# Patient Record
Sex: Female | Born: 1985 | Race: Asian | Hispanic: No | Marital: Married | Smoking: Never smoker
Health system: Southern US, Community
[De-identification: ages and names within clinical notes are randomized; demographics above are authoritative.]

---

## 2017-10-09 ENCOUNTER — Emergency Department (HOSPITAL_BASED_OUTPATIENT_CLINIC_OR_DEPARTMENT_OTHER): Payer: 59

## 2017-10-09 ENCOUNTER — Encounter (HOSPITAL_BASED_OUTPATIENT_CLINIC_OR_DEPARTMENT_OTHER): Payer: Self-pay | Admitting: Emergency Medicine

## 2017-10-09 ENCOUNTER — Other Ambulatory Visit: Payer: Self-pay

## 2017-10-09 ENCOUNTER — Emergency Department (HOSPITAL_BASED_OUTPATIENT_CLINIC_OR_DEPARTMENT_OTHER)
Admission: EM | Admit: 2017-10-09 | Discharge: 2017-10-09 | Disposition: A | Discharge status: Home / Self Care | Payer: 59 | Attending: Emergency Medicine | Admitting: Emergency Medicine

## 2017-10-09 DIAGNOSIS — Z3A08 8 weeks gestation of pregnancy: Secondary | ICD-10-CM | POA: Diagnosis not present

## 2017-10-09 DIAGNOSIS — O26851 Spotting complicating pregnancy, first trimester: Secondary | ICD-10-CM

## 2017-10-09 DIAGNOSIS — O209 Hemorrhage in early pregnancy, unspecified: Secondary | ICD-10-CM | POA: Diagnosis present

## 2017-10-09 LAB — CBC
HCT: 39.2 % (ref 36.0–46.0)
HEMOGLOBIN: 13.3 g/dL (ref 12.0–15.0)
MCH: 29.5 pg (ref 26.0–34.0)
MCHC: 33.9 g/dL (ref 30.0–36.0)
MCV: 86.9 fL (ref 78.0–100.0)
Platelets: 265 10*3/uL (ref 150–400)
RBC: 4.51 MIL/uL (ref 3.87–5.11)
RDW: 12.8 % (ref 11.5–15.5)
WBC: 6.5 10*3/uL (ref 4.0–10.5)

## 2017-10-09 LAB — URINALYSIS, ROUTINE W REFLEX MICROSCOPIC
Bilirubin Urine: NEGATIVE
Glucose, UA: NEGATIVE mg/dL
KETONES UR: 15 mg/dL — AB
NITRITE: NEGATIVE
PROTEIN: NEGATIVE mg/dL
Specific Gravity, Urine: 1.015 (ref 1.005–1.030)
pH: 7.5 (ref 5.0–8.0)

## 2017-10-09 LAB — URINALYSIS, MICROSCOPIC (REFLEX)

## 2017-10-09 LAB — HCG, QUANTITATIVE, PREGNANCY: HCG, BETA CHAIN, QUANT, S: 125504 m[IU]/mL — AB (ref <5)

## 2017-10-09 LAB — PREGNANCY, URINE: PREG TEST UR: POSITIVE — AB

## 2017-10-09 NOTE — ED Notes (Signed)
Patient transported to Ultrasound 

## 2017-10-09 NOTE — ED Notes (Signed)
Gravida 2, Para 0, AB 1

## 2017-10-09 NOTE — ED Notes (Signed)
ED Provider at bedside. 

## 2017-10-09 NOTE — ED Triage Notes (Signed)
Patient states that she is having light spotting with urination  - she is [redacted] weeks pregnant. Patient went to her OB dr last week and all was normal

## 2017-10-09 NOTE — Discharge Instructions (Addendum)
Call your obgyn for follow up  Return to the emergency department for heavy vaginal bleeding as we discussed.

## 2017-10-10 NOTE — ED Provider Notes (Signed)
[facility] EMERGENCY DEPARTMENT Provider Note   CSN: [id number] Arrival date & time: 10/09/17  4098     History   Chief Complaint Chief Complaint  Patient presents with  . Vaginal Bleeding    HPI Veronica Randall is a 32 y.o. female.  HPI Patient is a 32 year old G2, P0 A1 with spontaneous miscarriage at approximately 8 to 12 weeks previously who is currently [redacted] weeks pregnant.  She presents the emergency department today because of some scant vaginal spotting.  Denies abdominal pain.  No lightheadedness.  No dysuria or urinary frequency. History reviewed. No pertinent past medical history.  There are no active problems to display for this patient.   History reviewed. No pertinent surgical history.   OB History    Gravida  1   Para      Term      Preterm      AB      Living        SAB      TAB      Ectopic      Multiple      Live Births               Home Medications    Prior to Admission medications   Not on File    Family History No family history on file.  Social History Social History   Tobacco Use  . Smoking status: Never Smoker  . Smokeless tobacco: Never Used  Substance Use Topics  . Alcohol use: Not Currently    Frequency: Never  . Drug use: Not Currently     Allergies   Patient has no known allergies.   Review of Systems Review of Systems  All other systems reviewed and are negative.    Physical Exam Updated Vital Signs BP 112/64 (BP Location: Right Arm)   Pulse 71   Temp 98.5 F (36.9 C) (Oral)   Resp 16   Ht 5\' 3"  (1.6 m)   Wt 90.7 kg   SpO2 100%   BMI 35.43 kg/m   Physical Exam  Constitutional: She is oriented to person, place, and time. She appears well-developed and well-nourished. No distress.  HENT:  Head: Normocephalic and atraumatic.  Eyes: EOM are normal.  Neck: Normal range of motion.  Cardiovascular: Normal rate and regular rhythm.  Pulmonary/Chest: Effort normal.  Abdominal: Soft.  She exhibits no distension. There is no tenderness.  Musculoskeletal: Normal range of motion.  Neurological: She is alert and oriented to person, place, and time.  Skin: Skin is warm and dry.  Psychiatric: She has a normal mood and affect. Judgment normal.  Nursing note and vitals reviewed.    ED Treatments / Results  Labs (all labs ordered are listed, but only abnormal results are displayed) Labs Reviewed  PREGNANCY, URINE - Abnormal; Notable for the following components:      Result Value   Preg Test, Ur POSITIVE (*)    All other components within normal limits  HCG, QUANTITATIVE, PREGNANCY - Abnormal; Notable for the following components:   hCG, Beta Chain, Quant, S 125,504 (*)    All other components within normal limits  URINALYSIS, ROUTINE W REFLEX MICROSCOPIC - Abnormal; Notable for the following components:   APPearance CLOUDY (*)    Hgb urine dipstick MODERATE (*)    Ketones, ur 15 (*)    Leukocytes, UA SMALL (*)    All other components within normal limits  URINALYSIS, MICROSCOPIC (REFLEX) - Abnormal; Notable for the  following components:   Bacteria, UA MANY (*)    All other components within normal limits  CBC    EKG None  Radiology US Ob Comp < 14 Wks  Result Date: 10/09/2017 CLINICAL DATA:  Vaginal bleeding in pregnant patient. EXAM: OBSTETRIC <14 WK [place] AND TRANSVAGINAL OB US TECHNIQUE: Both transabdominal and transvaginal ultrasound examinations were performed for complete evaluation of the gestation as well as the maternal uterus, adnexal regions, and pelvic cul-de-sac. Transvaginal technique was performed to assess early pregnancy. COMPARISON:  None. FINDINGS: Intrauterine gestational sac: Single Yolk sac:  Visualized. Embryo:  Visualized. Cardiac Activity: Visualized. Heart Rate: 165 bpm MSD:   mm    w     d CRL:  17 mm   8 w   1 d                  [place] [facility]: May 20, 2018 Subchorionic hemorrhage:  None visualized. Maternal uterus/adnexae: The ovaries are only seen  transabdominally with no obvious abnormality. Minimal fluid in the region of the cervix is nonspecific but consistent with the history of spotting. No free fluid in the pelvis. IMPRESSION: Single live IUP.  No underlying cause for spotting identified. Electronically Signed   By: Gerome Sam III M.D   On: 10/09/2017 11:29   US Ob Transvaginal  Result Date: 10/09/2017 CLINICAL DATA:  Vaginal bleeding in pregnant patient. EXAM: OBSTETRIC <14 WK [place] AND TRANSVAGINAL OB US TECHNIQUE: Both transabdominal and transvaginal ultrasound examinations were performed for complete evaluation of the gestation as well as the maternal uterus, adnexal regions, and pelvic cul-de-sac. Transvaginal technique was performed to assess early pregnancy. COMPARISON:  None. FINDINGS: Intrauterine gestational sac: Single Yolk sac:  Visualized. Embryo:  Visualized. Cardiac Activity: Visualized. Heart Rate: 165 bpm MSD:   mm    w     d CRL:  17 mm   8 w   1 d                  [place] [facility]: May 20, 2018 Subchorionic hemorrhage:  None visualized. Maternal uterus/adnexae: The ovaries are only seen transabdominally with no obvious abnormality. Minimal fluid in the region of the cervix is nonspecific but consistent with the history of spotting. No free fluid in the pelvis. IMPRESSION: Single live IUP.  No underlying cause for spotting identified. Electronically Signed   By: Gerome Sam III M.D   On: 10/09/2017 11:29    Procedures Procedures (including critical care time)  Medications Ordered in ED Medications - No data to display   Initial Impression / Assessment and Plan / ED Course  I have reviewed the triage vital signs and the nursing notes.  Pertinent labs & imaging results that were available during my care of the patient were reviewed by me and considered in my medical decision making (see chart for details).     Reassuring ultrasound.  Patient reassured.  Outpatient OB follow-up.  Understands return to the ER or to [facility] for new or worsening symptoms  Final Clinical Impressions(s) / ED Diagnoses   Final diagnoses:  Spotting affecting pregnancy in first trimester    ED Discharge Orders    None       Azalia Bilis, MD 10/10/17 1304

## 2018-04-20 DIAGNOSIS — Z3400 Encounter for supervision of normal first pregnancy, unspecified trimester: Secondary | ICD-10-CM | POA: Diagnosis not present

## 2018-04-20 DIAGNOSIS — Z3A35 35 weeks gestation of pregnancy: Secondary | ICD-10-CM | POA: Diagnosis not present

## 2018-04-20 DIAGNOSIS — O99213 Obesity complicating pregnancy, third trimester: Secondary | ICD-10-CM | POA: Diagnosis not present

## 2018-04-27 DIAGNOSIS — O99213 Obesity complicating pregnancy, third trimester: Secondary | ICD-10-CM | POA: Diagnosis not present

## 2018-04-27 DIAGNOSIS — Z3A37 37 weeks gestation of pregnancy: Secondary | ICD-10-CM | POA: Diagnosis not present

## 2018-05-05 DIAGNOSIS — Z3A38 38 weeks gestation of pregnancy: Secondary | ICD-10-CM | POA: Diagnosis not present

## 2018-05-05 DIAGNOSIS — O99213 Obesity complicating pregnancy, third trimester: Secondary | ICD-10-CM | POA: Diagnosis not present

## 2018-05-12 DIAGNOSIS — Z3A39 39 weeks gestation of pregnancy: Secondary | ICD-10-CM | POA: Diagnosis not present

## 2018-05-17 DIAGNOSIS — O99213 Obesity complicating pregnancy, third trimester: Secondary | ICD-10-CM | POA: Diagnosis not present

## 2018-05-17 DIAGNOSIS — O26893 Other specified pregnancy related conditions, third trimester: Secondary | ICD-10-CM | POA: Diagnosis not present

## 2018-05-17 DIAGNOSIS — Z3A39 39 weeks gestation of pregnancy: Secondary | ICD-10-CM | POA: Diagnosis not present

## 2019-06-22 ENCOUNTER — Other Ambulatory Visit: Payer: Self-pay

## 2019-06-22 ENCOUNTER — Inpatient Hospital Stay (HOSPITAL_BASED_OUTPATIENT_CLINIC_OR_DEPARTMENT_OTHER)
Admission: AD | Admit: 2019-06-22 | Discharge: 2019-06-26 | DRG: 418 | Disposition: A | Discharge status: Home / Self Care | Payer: 59 | Attending: Internal Medicine | Admitting: Internal Medicine

## 2019-06-22 ENCOUNTER — Encounter (HOSPITAL_BASED_OUTPATIENT_CLINIC_OR_DEPARTMENT_OTHER): Payer: Self-pay | Admitting: Emergency Medicine

## 2019-06-22 ENCOUNTER — Inpatient Hospital Stay (HOSPITAL_COMMUNITY): Payer: 59

## 2019-06-22 DIAGNOSIS — E860 Dehydration: Secondary | ICD-10-CM | POA: Diagnosis present

## 2019-06-22 DIAGNOSIS — E876 Hypokalemia: Secondary | ICD-10-CM | POA: Diagnosis present

## 2019-06-22 DIAGNOSIS — K838 Other specified diseases of biliary tract: Secondary | ICD-10-CM | POA: Diagnosis not present

## 2019-06-22 DIAGNOSIS — R739 Hyperglycemia, unspecified: Secondary | ICD-10-CM | POA: Diagnosis present

## 2019-06-22 DIAGNOSIS — E871 Hypo-osmolality and hyponatremia: Secondary | ICD-10-CM | POA: Diagnosis present

## 2019-06-22 DIAGNOSIS — R932 Abnormal findings on diagnostic imaging of liver and biliary tract: Secondary | ICD-10-CM

## 2019-06-22 DIAGNOSIS — K219 Gastro-esophageal reflux disease without esophagitis: Secondary | ICD-10-CM | POA: Diagnosis present

## 2019-06-22 DIAGNOSIS — Z6841 Body Mass Index (BMI) 40.0 and over, adult: Secondary | ICD-10-CM | POA: Diagnosis not present

## 2019-06-22 DIAGNOSIS — R1011 Right upper quadrant pain: Secondary | ICD-10-CM | POA: Diagnosis present

## 2019-06-22 DIAGNOSIS — R7989 Other specified abnormal findings of blood chemistry: Secondary | ICD-10-CM | POA: Diagnosis not present

## 2019-06-22 DIAGNOSIS — K851 Biliary acute pancreatitis without necrosis or infection: Secondary | ICD-10-CM | POA: Diagnosis not present

## 2019-06-22 DIAGNOSIS — Z20822 Contact with and (suspected) exposure to covid-19: Secondary | ICD-10-CM | POA: Diagnosis present

## 2019-06-22 DIAGNOSIS — K8062 Calculus of gallbladder and bile duct with acute cholecystitis without obstruction: Secondary | ICD-10-CM | POA: Diagnosis present

## 2019-06-22 DIAGNOSIS — R7303 Prediabetes: Secondary | ICD-10-CM | POA: Diagnosis present

## 2019-06-22 DIAGNOSIS — K8020 Calculus of gallbladder without cholecystitis without obstruction: Secondary | ICD-10-CM

## 2019-06-22 DIAGNOSIS — E66813 Obesity, class 3: Secondary | ICD-10-CM

## 2019-06-22 HISTORY — DX: Morbid (severe) obesity due to excess calories: E66.01

## 2019-06-22 HISTORY — DX: Obesity, class 3: E66.813

## 2019-06-22 LAB — RENAL FUNCTION PANEL
Albumin: 3.9 g/dL (ref 3.5–5.0)
Anion gap: 13 (ref 5–15)
BUN: 7 mg/dL (ref 6–20)
CO2: 25 mmol/L (ref 22–32)
Calcium: 9.2 mg/dL (ref 8.9–10.3)
Chloride: 97 mmol/L — ABNORMAL LOW (ref 98–111)
Creatinine, Ser: 0.58 mg/dL (ref 0.44–1.00)
GFR calc Af Amer: >60 mL/min (ref >60)
GFR calc non Af Amer: >60 mL/min (ref >60)
Glucose, Bld: 129 mg/dL — ABNORMAL HIGH (ref 70–99)
Phosphorus: 3 mg/dL (ref 2.5–4.6)
Potassium: 3.9 mmol/L (ref 3.5–5.1)
Sodium: 135 mmol/L (ref 135–145)

## 2019-06-22 LAB — CBC WITH DIFFERENTIAL/PLATELET
Abs Immature Granulocytes: 0.04 10*3/uL (ref 0.00–0.07)
Basophils Absolute: 0 10*3/uL (ref 0.0–0.1)
Basophils Relative: 0 %
Eosinophils Absolute: 0.2 10*3/uL (ref 0.0–0.5)
Eosinophils Relative: 2 %
HCT: 42.1 % (ref 36.0–46.0)
Hemoglobin: 13.3 g/dL (ref 12.0–15.0)
Immature Granulocytes: 1 %
Lymphocytes Relative: 28 %
Lymphs Abs: 2.2 10*3/uL (ref 0.7–4.0)
MCH: 27.7 pg (ref 26.0–34.0)
MCHC: 31.6 g/dL (ref 30.0–36.0)
MCV: 87.5 fL (ref 80.0–100.0)
Monocytes Absolute: 0.7 10*3/uL (ref 0.1–1.0)
Monocytes Relative: 8 %
Neutro Abs: 4.9 10*3/uL (ref 1.7–7.7)
Neutrophils Relative %: 61 %
Platelets: 288 10*3/uL (ref 150–400)
RBC: 4.81 MIL/uL (ref 3.87–5.11)
RDW: 13.5 % (ref 11.5–15.5)
WBC: 8 10*3/uL (ref 4.0–10.5)
nRBC: 0 % (ref 0.0–0.2)

## 2019-06-22 LAB — MAGNESIUM: Magnesium: 1.9 mg/dL (ref 1.7–2.4)

## 2019-06-22 LAB — COMPREHENSIVE METABOLIC PANEL
ALT: 349 U/L — ABNORMAL HIGH (ref 0–44)
AST: 164 U/L — ABNORMAL HIGH (ref 15–41)
Albumin: 3.7 g/dL (ref 3.5–5.0)
Alkaline Phosphatase: 141 U/L — ABNORMAL HIGH (ref 38–126)
Anion gap: 11 (ref 5–15)
BUN: 8 mg/dL (ref 6–20)
CO2: 23 mmol/L (ref 22–32)
Calcium: 9 mg/dL (ref 8.9–10.3)
Chloride: 100 mmol/L (ref 98–111)
Creatinine, Ser: 0.84 mg/dL (ref 0.44–1.00)
GFR calc Af Amer: >60 mL/min (ref >60)
GFR calc non Af Amer: >60 mL/min (ref >60)
Glucose, Bld: 164 mg/dL — ABNORMAL HIGH (ref 70–99)
Potassium: 3.3 mmol/L — ABNORMAL LOW (ref 3.5–5.1)
Sodium: 134 mmol/L — ABNORMAL LOW (ref 135–145)
Total Bilirubin: 4.1 mg/dL — ABNORMAL HIGH (ref 0.3–1.2)
Total Protein: 7.4 g/dL (ref 6.5–8.1)

## 2019-06-22 LAB — HEPATIC FUNCTION PANEL
ALT: 318 U/L — ABNORMAL HIGH (ref 0–44)
AST: 148 U/L — ABNORMAL HIGH (ref 15–41)
Albumin: 3.9 g/dL (ref 3.5–5.0)
Alkaline Phosphatase: 151 U/L — ABNORMAL HIGH (ref 38–126)
Bilirubin, Direct: 2.9 mg/dL — ABNORMAL HIGH (ref 0.0–0.2)
Indirect Bilirubin: 2.3 mg/dL — ABNORMAL HIGH (ref 0.3–0.9)
Total Bilirubin: 5.2 mg/dL — ABNORMAL HIGH (ref 0.3–1.2)
Total Protein: 7.6 g/dL (ref 6.5–8.1)

## 2019-06-22 LAB — URINALYSIS, MICROSCOPIC (REFLEX): RBC / HPF: NONE SEEN RBC/hpf (ref 0–5)

## 2019-06-22 LAB — LACTIC ACID, PLASMA: Lactic Acid, Venous: 1.4 mmol/L (ref 0.5–1.9)

## 2019-06-22 LAB — URINALYSIS, ROUTINE W REFLEX MICROSCOPIC
Glucose, UA: NEGATIVE mg/dL
Hgb urine dipstick: NEGATIVE
Ketones, ur: NEGATIVE mg/dL
Nitrite: NEGATIVE
Protein, ur: NEGATIVE mg/dL
Specific Gravity, Urine: 1.02 (ref 1.005–1.030)
pH: 6 (ref 5.0–8.0)

## 2019-06-22 LAB — SARS CORONAVIRUS 2 BY RT PCR (HOSPITAL ORDER, PERFORMED IN ~~LOC~~ HOSPITAL LAB): SARS Coronavirus 2: NEGATIVE

## 2019-06-22 LAB — PREGNANCY, URINE: Preg Test, Ur: NEGATIVE

## 2019-06-22 LAB — LIPASE, BLOOD: Lipase: 7228 U/L — ABNORMAL HIGH (ref 11–51)

## 2019-06-22 MED ORDER — SODIUM CHLORIDE (PF) 0.9 % IJ SOLN
INTRAMUSCULAR | Status: AC
  Administered 2019-06-22: 10 mL
  Filled 2019-06-22: qty 50

## 2019-06-22 MED ORDER — HYDROMORPHONE HCL 1 MG/ML IJ SOLN
1.0000 mg | INTRAMUSCULAR | Status: AC | PRN
  Administered 2019-06-22 (×3): 1 mg via INTRAVENOUS
  Filled 2019-06-22 (×3): qty 1

## 2019-06-22 MED ORDER — IOHEXOL 300 MG/ML  SOLN
100.0000 mL | Freq: Once | INTRAMUSCULAR | Status: AC | PRN
  Administered 2019-06-22: 100 mL via INTRAVENOUS

## 2019-06-22 MED ORDER — DIPHENHYDRAMINE HCL 50 MG/ML IJ SOLN
12.5000 mg | Freq: Four times a day (QID) | INTRAMUSCULAR | Status: DC | PRN
  Administered 2019-06-24 – 2019-06-25 (×2): 12.5 mg via INTRAVENOUS
  Filled 2019-06-22 (×3): qty 1

## 2019-06-22 MED ORDER — LACTATED RINGERS IV BOLUS
1000.0000 mL | Freq: Once | INTRAVENOUS | Status: AC
  Administered 2019-06-22: 1000 mL via INTRAVENOUS

## 2019-06-22 MED ORDER — ONDANSETRON HCL 4 MG/2ML IJ SOLN
4.0000 mg | Freq: Once | INTRAMUSCULAR | Status: AC
  Administered 2019-06-22: 4 mg via INTRAVENOUS
  Filled 2019-06-22: qty 2

## 2019-06-22 MED ORDER — ONDANSETRON HCL 4 MG/2ML IJ SOLN
INTRAMUSCULAR | Status: AC
  Filled 2019-06-22: qty 2

## 2019-06-22 MED ORDER — SODIUM CHLORIDE 0.9 % IV BOLUS
1000.0000 mL | Freq: Once | INTRAVENOUS | Status: AC
  Administered 2019-06-22: 1000 mL via INTRAVENOUS

## 2019-06-22 MED ORDER — HYDROMORPHONE HCL 1 MG/ML IJ SOLN: 0.5000 mg | INTRAMUSCULAR | Status: DC | PRN

## 2019-06-22 MED ORDER — ONDANSETRON HCL 4 MG/2ML IJ SOLN
4.0000 mg | Freq: Four times a day (QID) | INTRAMUSCULAR | Status: DC | PRN
  Administered 2019-06-22 – 2019-06-23 (×2): 4 mg via INTRAVENOUS
  Filled 2019-06-22 (×2): qty 2

## 2019-06-22 MED ORDER — LACTATED RINGERS IV SOLN: INTRAVENOUS | Status: DC

## 2019-06-22 MED ORDER — ONDANSETRON HCL 4 MG/2ML IJ SOLN
4.0000 mg | Freq: Once | INTRAMUSCULAR | Status: AC
  Administered 2019-06-22: 4 mg via INTRAVENOUS

## 2019-06-22 MED ORDER — HYDROMORPHONE HCL 1 MG/ML IJ SOLN
1.0000 mg | INTRAMUSCULAR | Status: DC | PRN
  Administered 2019-06-22 – 2019-06-25 (×7): 1 mg via INTRAVENOUS
  Filled 2019-06-22 (×7): qty 1

## 2019-06-22 MED ORDER — PANTOPRAZOLE SODIUM 40 MG IV SOLR
40.0000 mg | INTRAVENOUS | Status: DC
  Administered 2019-06-22 – 2019-06-24 (×3): 40 mg via INTRAVENOUS
  Filled 2019-06-22 (×3): qty 40

## 2019-06-22 MED ORDER — POTASSIUM CHLORIDE 20 MEQ PO PACK
40.0000 meq | PACK | Freq: Once | ORAL | Status: AC
  Administered 2019-06-22: 40 meq via ORAL
  Filled 2019-06-22: qty 2

## 2019-06-22 MED ORDER — HYDROMORPHONE HCL 1 MG/ML IJ SOLN
1.0000 mg | Freq: Once | INTRAMUSCULAR | Status: AC
  Administered 2019-06-22: 1 mg via INTRAVENOUS
  Filled 2019-06-22: qty 1

## 2019-06-22 NOTE — H&P (Signed)
History and Physical    Veronica Randall GNF:[id number] DOB: Jun 20, 1985 DOA: 06/22/2019  PCP: Patient, No Pcp Per  Patient coming from: Transfer from med [facility]   Chief Complaint:  Chief Complaint  Patient presents with  . Abdominal Pain  . Nausea     HPI:    34 year old female with no significant past medical history who presents to [place] [facility] medical floor as a transfer from med [facility] emergency department for suspected gallstone pancreatitis.  Patient explains that for the past 1 year she has been experiencing episodes of right upper quadrant and epigastric pain.  These episodes typically would occur approximate 1 hour after eating a spicy meal.  Episodes were typically self-limiting but in the past 1 week they have become severe and unrelenting.  Patient explains that approximately 1 week ago she developed severe epigastric abdominal pain, sharp in quality, radiating to behind the shoulder blades.  Pain is waxing and waning and associated with nausea with frequent bouts of bilious vomiting.  Patient is also been experiencing associated watery diarrhea over the span of time.  Patient denies fevers, dysuria, low back pain, sick contacts, recent travel,, cough, shortness of breath or confirmed contact with COVID-19.  Patient presented to her outpatient provider in [place] on 6/9.  At that time patient was felt to be suffering from cholelithiasis.  Right upper quadrant ultrasound was ordered as well as.  Antiemetics and patient was sent home.  Patient symptoms continue to worsen and she eventually presented to med [facility] emergency department the morning of 6/11 for evaluation.  Upon evaluation at [facility], lipase was found to be 7228 concerning for pancreatitis.  Patient was found to have marked abnormalities in her hepatic function panel including AST of 164, ALT of 349 and bilirubin of 4.1.  Ultrasound performed on 6 9 revealed cholelithiasis  without cholecystitis.  Clinically patient was felt to be suffering from acute gallstone pancreatitis.  Case was discussed with Dr. Andrey Campanile with general surgery who stated that he would consult on the patient and the patient was transferred to [facility] for medical management of suspected pancreatitis.     Review of Systems: A 10-system review of systems has been performed and all systems are negative with the exception of what is listed in the HPI.    Past Medical History:  Diagnosis Date  . Class 3 severe obesity due to excess calories without serious comorbidity with body mass index (BMI) of 40.0 to 44.9 in adult [facility]) 06/22/2019    History reviewed. No pertinent surgical history.   reports that she has never smoked. She has never used smokeless tobacco. She reports previous alcohol use. She reports previous drug use.  No Known Allergies  Family History  Problem Relation Age of Onset  . Other Neg Hx      Prior to Admission medications   Medication Sig Start Date End Date Taking? Authorizing Provider  HYDROcodone-acetaminophen (NORCO/VICODIN) 5-325 MG tablet Take 1 tablet by mouth every 6 (six) hours as needed for moderate pain or severe pain.  06/20/19 06/25/19 Yes [provider]  Multiple Vitamins-Minerals (MULTIVITAMIN ADULT) CHEW Chew 1 tablet by mouth daily.   Yes [provider]  ondansetron (ZOFRAN-ODT) 4 MG disintegrating tablet Take 4 mg by mouth every 8 (eight) hours as needed for nausea or vomiting.  06/20/19 06/27/19 Yes [provider]    Physical Exam: Vitals:   06/22/19 1404 06/22/19 1922 06/22/19 2028 06/22/19 2031  BP: 103/67 (!) 122/92  (!) 138/93  Pulse: 78 (!) 59  75  Resp: 14 20  18   Temp:  98.4 F (36.9 C)  97.7 F (36.5 C)  TempSrc:  Oral  Oral  SpO2: 99% 100%  98%  Weight:   103.5 kg   Height:   5\' 3"  (1.6 m)     Constitutional: Acute alert and oriented x3, in mild distress due to pain.  Patient is obese. Skin:  no rashes, no lesions, poor skin turgor noted. Eyes: Pupils are equally reactive to light.  No evidence of scleral icterus or conjunctival pallor.  ENMT: Dry mucous membranes noted.  Posterior pharynx clear of any exudate or lesions.   Neck: normal, supple, no masses, no thyromegaly.  No evidence of jugular venous distension.   Respiratory: clear to auscultation bilaterally, no wheezing, no crackles. Normal respiratory effort. No accessory muscle use.  Cardiovascular: Regular rate and rhythm, no murmurs / rubs / gallops. No extremity edema. 2+ pedal pulses. No carotid bruits.  Chest:   Nontender without crepitus or deformity.   Back:   Nontender without crepitus or deformity. Abdomen: Generalized abdominal tenderness, worst in the epigastric and right upper quadrants.  Abdomen is soft.  No evidence of intra-abdominal masses.  Positive bowel sounds noted in all quadrants.   Musculoskeletal: No joint deformity upper and lower extremities. Good ROM, no contractures. Normal muscle tone.  Neurologic: CN 2-12 grossly intact. Sensation intact, strength noted to be 5 out of 5 in all 4 extremities.  Patient is following all commands.  Patient is responsive to verbal stimuli.   Psychiatric: Patient presents as a normal mood with appropriate affect.  Patient seems to possess insight as to theircurrent situation.     Labs on Admission: I have personally reviewed following labs and imaging studies -   CBC: Recent Labs  Lab 06/22/19 0849  WBC 8.0  NEUTROABS 4.9  HGB 13.3  HCT 42.1  MCV 87.5  PLT 235   Basic Metabolic Panel: Recent Labs  Lab 06/22/19 0849  NA 134*  K 3.3*  CL 100  CO2 23  GLUCOSE 164*  BUN 8  CREATININE 0.84  CALCIUM 9.0   GFR: Estimated Creatinine Clearance: 110.5 mL/min (by C-G formula based on SCr of 0.84 mg/dL). Liver Function Tests: Recent Labs  Lab 06/22/19 0849  AST 164*  ALT 349*  ALKPHOS 141*  BILITOT 4.1*  PROT 7.4  ALBUMIN 3.7   Recent Labs  Lab  06/22/19 0849  LIPASE 7,228*   No results for input(s): AMMONIA in the last 168 hours. Coagulation Profile: No results for input(s): INR, PROTIME in the last 168 hours. Cardiac Enzymes: No results for input(s): CKTOTAL, CKMB, CKMBINDEX, TROPONINI in the last 168 hours. BNP (last 3 results) No results for input(s): PROBNP in the last 8760 hours. HbA1C: No results for input(s): HGBA1C in the last 72 hours. CBG: No results for input(s): GLUCAP in the last 168 hours. Lipid Profile: No results for input(s): CHOL, HDL, LDLCALC, TRIG, CHOLHDL, LDLDIRECT in the last 72 hours. Thyroid Function Tests: No results for input(s): TSH, T4TOTAL, FREET4, T3FREE, THYROIDAB in the last 72 hours. Anemia Panel: No results for input(s): VITAMINB12, FOLATE, FERRITIN, TIBC, IRON, RETICCTPCT in the last 72 hours. Urine analysis:    Component Value Date/Time   COLORURINE YELLOW 06/22/2019 1100   APPEARANCEUR CLEAR 06/22/2019 1100   LABSPEC 1.020 06/22/2019 1100   PHURINE 6.0 06/22/2019 1100   GLUCOSEU NEGATIVE 06/22/2019 1100   HGBUR NEGATIVE  06/22/2019 1100   BILIRUBINUR MODERATE (A) 06/22/2019 1100   KETONESUR NEGATIVE 06/22/2019 1100   PROTEINUR NEGATIVE 06/22/2019 1100   NITRITE NEGATIVE 06/22/2019 1100   LEUKOCYTESUR TRACE (A) 06/22/2019 1100    Radiological Exams on Admission - Personally Reviewed: No results found.  Assessment/Plan Principal Problem:   Acute gallstone pancreatitis   Patient presented with markedly elevated lipase and abnormal liver enzymes in the setting of recent diagnosis of cholelithiasis.  Patient provides a history that seems consistent with progressively worsening cholelithiasis and has now likely developed gallstone pancreatitis.  Obtaining CT imaging of the abdomen to confirm there is no evidence of infection, pseudocyst or necrosis of the pancreas.  Case was already discussed with Dr. Andrey Campanile by the emergency department provider who stated that he will  evaluate the patient upon arrival to [facility].  Considering the fact that he would be unlikely to immediately take the patient to the operating room, he can be notified of the patient's arrival in the morning.  Patient will be kept n.p.o. with exception of ice chips and sips of clears.  With intravenous isotonic fluids  Intravenous opiate-based analgesics for substantial pain  Intravenous antiemetics for nausea  Active Problems:   Dehydration with hyponatremia   Mild hyponatremia with clinical evidence of dehydration  Hydrating patient with intravenous isotonic fluids  Monitoring sodium levels with serial chemistries  Hypokalemia secondary to gastrointestinal losses   Replacing potassium with oral potassium chloride elixir as tolerated  Monitoring potassium levels with serial chemistries.    Hyperglycemia   Notable hyperglycemia on chemistry this morning  Pending hemoglobin A1c    Class 3 severe obesity due to excess calories without serious comorbidity with body mass index (BMI) of 40.0 to 44.9 in adult [facility])  Once patient is clinically improved will counsel on caloric restriction and regular physical activity    GERD without esophagitis  Documented history of gastroesophageal reflux disease per review of older records  Will place patient on Protonix 40 mg IV every 24 hours   Code Status:  Full code Family Communication: Deferred  Status is: Inpatient  Remains inpatient appropriate because:IV treatments appropriate due to intensity of illness or inability to take PO and Inpatient level of care appropriate due to severity of illness   Dispo: The patient is from: Home              Anticipated d/c is to: Home              Anticipated d/c date is: 3 days              Patient currently is not medically stable to d/c.        Marinda Elk MD [facility] Pager [phone]  If 7PM-7AM, please contact night-coverage [email or web address] Use  universal [place] password for that web site. If you do not have the password, please call the hospital operator.  06/22/2019, 10:15 PM

## 2019-06-22 NOTE — ED Notes (Signed)
Report given to: Ethlyn Daniels., RN with [facility].

## 2019-06-22 NOTE — ED Notes (Signed)
Report given to: Isatu, RN at [facility].

## 2019-06-22 NOTE — ED Provider Notes (Signed)
[place] EMERGENCY DEPARTMENT Provider Note   CSN: [id number] Arrival date & time: 06/22/19  0809     History Chief Complaint  Patient presents with  . Abdominal Pain  . Nausea    Veronica Randall is a 34 y.o. female.  HPI 34 year old female with abdominal pain and vomiting.  Started at 6 AM.  Multiple episodes of emesis.  After the third 1 she has noticed a little bit of blood in her emesis.  The pain is epigastric.  She has been having pain like this on and off for a while and her doctor ordered labs and an ultrasound yesterday that showed gallstones.  Pain is currently an 8 out of 10.  She took a hydrocodone she was prescribed but threw it up. No fevers.  History reviewed. No pertinent past medical history.  Patient Active Problem List   Diagnosis Date Noted  . Acute pancreatitis 06/22/2019    History reviewed. No pertinent surgical history.   OB History    Gravida  1   Para      Term      Preterm      AB      Living        SAB      TAB      Ectopic      Multiple      Live Births              History reviewed. No pertinent family history.  Social History   Tobacco Use  . Smoking status: Never Smoker  . Smokeless tobacco: Never Used  Substance Use Topics  . Alcohol use: Not Currently  . Drug use: Not Currently    Home Medications Prior to Admission medications   Not on File    Allergies    Patient has no known allergies.  Review of Systems   Review of Systems  Constitutional: Negative for fever.  Gastrointestinal: Positive for abdominal pain and vomiting.  All other systems reviewed and are negative.   Physical Exam Updated Vital Signs BP 124/66   Pulse 64   Temp 97.8 F (36.6 C)   Resp 18   Ht 5\' 4"  (1.626 m)   Wt 104 kg   SpO2 99%   Breastfeeding No   BMI 39.36 kg/m   Physical Exam Vitals and nursing note reviewed.  Constitutional:      Appearance: She is well-developed. She is obese. She is not  diaphoretic.  HENT:     Head: Normocephalic and atraumatic.     Right Ear: External ear normal.     Left Ear: External ear normal.     Nose: Nose normal.  Eyes:     General:        Right eye: No discharge.        Left eye: No discharge.  Cardiovascular:     Rate and Rhythm: Normal rate and regular rhythm.     Heart sounds: Normal heart sounds.  Pulmonary:     Effort: Pulmonary effort is normal.     Breath sounds: Normal breath sounds.  Abdominal:     Palpations: Abdomen is soft.     Tenderness: There is abdominal tenderness in the right upper quadrant, epigastric area and left upper quadrant.  Skin:    General: Skin is warm and dry.  Neurological:     Mental Status: She is alert.  Psychiatric:        Mood and Affect: Mood is not anxious.  ED Results / Procedures / Treatments   Labs (all labs ordered are listed, but only abnormal results are displayed) Labs Reviewed  COMPREHENSIVE METABOLIC PANEL - Abnormal; Notable for the following components:      Result Value   Sodium 134 (*)    Potassium 3.3 (*)    Glucose, Bld 164 (*)    AST 164 (*)    ALT 349 (*)    Alkaline Phosphatase 141 (*)    Total Bilirubin 4.1 (*)    All other components within normal limits  LIPASE, BLOOD - Abnormal; Notable for the following components:   Lipase 7,228 (*)    All other components within normal limits  URINALYSIS, ROUTINE W REFLEX MICROSCOPIC - Abnormal; Notable for the following components:   Bilirubin Urine MODERATE (*)    Leukocytes,Ua TRACE (*)    All other components within normal limits  URINALYSIS, MICROSCOPIC (REFLEX) - Abnormal; Notable for the following components:   Bacteria, UA MANY (*)    All other components within normal limits  SARS CORONAVIRUS 2 BY RT PCR (HOSPITAL ORDER, PERFORMED IN [facility])  PREGNANCY, URINE  CBC WITH DIFFERENTIAL/PLATELET    EKG None  Radiology No results found.  Procedures Procedures (including critical care  time)  Medications Ordered in ED Medications  HYDROmorphone (DILAUDID) injection 1 mg (1 mg Intravenous Given 06/22/19 1110)  ondansetron (ZOFRAN) injection 4 mg ( Intravenous Not Given 06/22/19 0915)  HYDROmorphone (DILAUDID) injection 1 mg (1 mg Intravenous Given 06/22/19 0938)  sodium chloride 0.9 % bolus 1,000 mL (0 mLs Intravenous Stopped 06/22/19 1105)  lactated ringers bolus 1,000 mL (0 mLs Intravenous Stopped 06/22/19 1221)    ED Course  I have reviewed the triage vital signs and the nursing notes.  Pertinent labs & imaging results that were available during my care of the patient were reviewed by me and considered in my medical decision making (see chart for details).    MDM Rules/Calculators/A&P                          Patient's records were reviewed and indicates she had an ultrasound yesterday that showed cholelithiasis but no cholecystitis.  Has significant epigastric abdominal pain.  This did get relief with Dilaudid.  Labs show mildly abnormal LFTs in addition to acute pancreatitis.  Appears to be gallstone pancreatitis.  I do not see any new reason for new imaging given the ultrasound from yesterday.  I did discuss with Dr. Andrey Campanile and general surgery will consult.  Discussed with Dr. Joseph Art for admission. Final Clinical Impression(s) / ED Diagnoses Final diagnoses:  Gallstone pancreatitis    Rx / DC Orders ED Discharge Orders    None       Pricilla Loveless, MD 06/22/19 1223

## 2019-06-22 NOTE — ED Triage Notes (Signed)
Pt here with recent hx of gall stones. Epigastric pain that is 10/10 with N/V.

## 2019-06-22 NOTE — ED Notes (Signed)
Patient is aware urine is needed. Unable at this time

## 2019-06-23 LAB — CBC WITH DIFFERENTIAL/PLATELET
Abs Immature Granulocytes: 0.05 10*3/uL (ref 0.00–0.07)
Basophils Absolute: 0 10*3/uL (ref 0.0–0.1)
Basophils Relative: 0 %
Eosinophils Absolute: 0.1 10*3/uL (ref 0.0–0.5)
Eosinophils Relative: 1 %
HCT: 38.9 % (ref 36.0–46.0)
Hemoglobin: 12.6 g/dL (ref 12.0–15.0)
Immature Granulocytes: 1 %
Lymphocytes Relative: 17 %
Lymphs Abs: 1.4 10*3/uL (ref 0.7–4.0)
MCH: 28.2 pg (ref 26.0–34.0)
MCHC: 32.4 g/dL (ref 30.0–36.0)
MCV: 87 fL (ref 80.0–100.0)
Monocytes Absolute: 0.6 10*3/uL (ref 0.1–1.0)
Monocytes Relative: 7 %
Neutro Abs: 5.8 10*3/uL (ref 1.7–7.7)
Neutrophils Relative %: 74 %
Platelets: 285 10*3/uL (ref 150–400)
RBC: 4.47 MIL/uL (ref 3.87–5.11)
RDW: 13.7 % (ref 11.5–15.5)
WBC: 7.9 10*3/uL (ref 4.0–10.5)
nRBC: 0 % (ref 0.0–0.2)

## 2019-06-23 LAB — HEPATITIS PANEL, ACUTE
HCV Ab: NONREACTIVE
Hep A IgM: NONREACTIVE
Hep B C IgM: NONREACTIVE
Hepatitis B Surface Ag: NONREACTIVE

## 2019-06-23 LAB — COMPREHENSIVE METABOLIC PANEL
ALT: 274 U/L — ABNORMAL HIGH (ref 0–44)
AST: 122 U/L — ABNORMAL HIGH (ref 15–41)
Albumin: 3.4 g/dL — ABNORMAL LOW (ref 3.5–5.0)
Alkaline Phosphatase: 130 U/L — ABNORMAL HIGH (ref 38–126)
Anion gap: 11 (ref 5–15)
BUN: 7 mg/dL (ref 6–20)
CO2: 23 mmol/L (ref 22–32)
Calcium: 9.1 mg/dL (ref 8.9–10.3)
Chloride: 101 mmol/L (ref 98–111)
Creatinine, Ser: 0.6 mg/dL (ref 0.44–1.00)
GFR calc Af Amer: >60 mL/min (ref >60)
GFR calc non Af Amer: >60 mL/min (ref >60)
Glucose, Bld: 112 mg/dL — ABNORMAL HIGH (ref 70–99)
Potassium: 3.8 mmol/L (ref 3.5–5.1)
Sodium: 135 mmol/L (ref 135–145)
Total Bilirubin: 4.4 mg/dL — ABNORMAL HIGH (ref 0.3–1.2)
Total Protein: 6.8 g/dL (ref 6.5–8.1)

## 2019-06-23 LAB — HEMOGLOBIN A1C
Hgb A1c MFr Bld: 5.8 % — ABNORMAL HIGH (ref 4.8–5.6)
Mean Plasma Glucose: 119.76 mg/dL

## 2019-06-23 LAB — GLUCOSE, CAPILLARY: Glucose-Capillary: 115 mg/dL — ABNORMAL HIGH (ref 70–99)

## 2019-06-23 LAB — MAGNESIUM: Magnesium: 1.9 mg/dL (ref 1.7–2.4)

## 2019-06-23 LAB — HIV ANTIBODY (ROUTINE TESTING W REFLEX): HIV Screen 4th Generation wRfx: NONREACTIVE

## 2019-06-23 LAB — LIPASE, BLOOD: Lipase: 372 U/L — ABNORMAL HIGH (ref 11–51)

## 2019-06-23 MED ORDER — HEPARIN SODIUM (PORCINE) 5000 UNIT/ML IJ SOLN
5000.0000 [IU] | Freq: Three times a day (TID) | INTRAMUSCULAR | Status: DC
  Administered 2019-06-23 – 2019-06-26 (×5): 5000 [IU] via SUBCUTANEOUS
  Filled 2019-06-23 (×5): qty 1

## 2019-06-23 MED ORDER — LACTATED RINGERS IV SOLN: INTRAVENOUS | Status: AC

## 2019-06-23 MED ORDER — SENNOSIDES-DOCUSATE SODIUM 8.6-50 MG PO TABS: 2.0000 | ORAL_TABLET | Freq: Every evening | ORAL | Status: DC | PRN

## 2019-06-23 MED ORDER — CEFAZOLIN SODIUM-DEXTROSE 2-4 GM/100ML-% IV SOLN
2.0000 g | INTRAVENOUS | Status: AC
  Administered 2019-06-24: 2 g via INTRAVENOUS

## 2019-06-23 MED ORDER — POLYETHYLENE GLYCOL 3350 17 G PO PACK: 17.0000 g | PACK | Freq: Every day | ORAL | Status: DC | PRN

## 2019-06-23 MED ORDER — ACETAMINOPHEN 500 MG PO TABS
500.0000 mg | ORAL_TABLET | Freq: Four times a day (QID) | ORAL | Status: DC | PRN
  Administered 2019-06-23: 500 mg via ORAL
  Filled 2019-06-23: qty 1

## 2019-06-23 NOTE — Progress Notes (Signed)
PROGRESS NOTE    Veronica Randall  OZH:[id number] DOB: 06-22-1985 DOA: 06/22/2019 PCP: Patient, No Pcp Per   Brief Narrative:  34 year old with no significant medical history presents to the hospital with complaints of abdominal pain found to have gallstone pancreatitis.  Had transaminitis with elevated total bilirubin.  General surgery consulted.   Assessment & Plan:   Principal Problem:   Acute gallstone pancreatitis Active Problems:   Dehydration with hyponatremia   Hyperglycemia   Class 3 severe obesity due to excess calories without serious comorbidity with body mass index (BMI) of 40.0 to 44.9 in adult [facility])   GERD without esophagitis   Hypokalemia due to excessive gastrointestinal loss of potassium  Acute gallstone pancreatitis Mild to moderate dehydration with hyponatremia -Trend LFTs and lipase levels.  This morning lipase has significantly trended down. -Currently clear liquid diet, n.p.o. past midnight, aggressive IV fluids.  Pain control, bowel regimen, antiemetics -Seen by general surgery-plans for laparoscopic cholecystectomy once pancreatitis improved -Monitor electrolytes. -accuchecks every 6 hours while n.p.o.  Hyperglycemia -Hemoglobin A1c-pending; accuchecks  Obesity with BMI greater than 40 -Outpatient follow-up with PCP, recommend weight loss diet and exercise  GERD without esophagitis -PPI    DVT prophylaxis: Subcu heparin Code Status: Full code Family Communication: None  Status is: Inpatient  Remains inpatient appropriate because:IV treatments appropriate due to intensity of illness or inability to take PO   Dispo: The patient is from: Home              Anticipated d/c is to: Home              Anticipated d/c date is: 2 days              Patient currently is not medically stable to d/c.  Awaiting abdominal pain to improve along with pancreatitis, eventually may need laparoscopic cholecystectomy likely next 1-2 days.  Defer this to general  surgery.    Subjective: Feels much better this morning passing gas.  Her abdominal discomfort but nowhere as near as yesterday.  Review of Systems Otherwise negative except as per HPI, including: General: Denies fever, chills, night sweats or unintended weight loss. Resp: Denies cough, wheezing, shortness of breath. Cardiac: Denies chest pain, palpitations, orthopnea, paroxysmal nocturnal dyspnea. GI: Denies abdominal pain, nausea, vomiting, diarrhea or constipation GU: Denies dysuria, frequency, hesitancy or incontinence MS: Denies muscle aches, joint pain or swelling Neuro: Denies headache, neurologic deficits (focal weakness, numbness, tingling), abnormal gait Psych: Denies anxiety, depression, SI/HI/AVH Skin: Denies new rashes or lesions ID: Denies sick contacts, exotic exposures, travel  Examination: Constitutional: Not in acute distress Respiratory: Clear to auscultation bilaterally Cardiovascular: Normal sinus rhythm, no rubs Abdomen: Nontender nondistended good bowel sounds Musculoskeletal: No edema noted Skin: No rashes seen Neurologic: CN 2-12 grossly intact.  And nonfocal Psychiatric: Normal judgment and insight. Alert and oriented x 3. Normal mood.  Objective: Vitals:   06/22/19 2028 06/22/19 2031 06/23/19 0028 06/23/19 0423  BP:  (!) 138/93 111/70 115/63  Pulse:  75 61 89  Resp:  18 16 16   Temp:  97.7 F (36.5 C) 98.1 F (36.7 C) 98.5 F (36.9 C)  TempSrc:  Oral Oral Oral  SpO2:  98% 97% 95%  Weight: 103.5 kg     Height: 5\' 3"  (1.6 m)       Intake/Output Summary (Last 24 hours) at 06/23/2019 0818 Last data filed at 06/22/2019 1221 Gross per 24 hour  Intake 2000 ml  Output --  Net 2000 ml  Filed Weights   06/22/19 0840 06/22/19 2028  Weight: 104 kg 103.5 kg     Data Reviewed:   CBC: Recent Labs  Lab 06/22/19 0849 06/23/19 0549  WBC 8.0 7.9  NEUTROABS 4.9 5.8  HGB 13.3 12.6  HCT 42.1 38.9  MCV 87.5 87.0  PLT 288 285   Basic Metabolic  Panel: Recent Labs  Lab 06/22/19 0849 06/22/19 2204 06/23/19 0549  NA 134* 135 135  K 3.3* 3.9 3.8  CL 100 97* 101  CO2 23 25 23   GLUCOSE 164* 129* 112*  BUN 8 7 7   CREATININE 0.84 0.58 0.60  CALCIUM 9.0 9.2 9.1  MG  --  1.9 1.9  PHOS  --  3.0  --    GFR: Estimated Creatinine Clearance: 116 mL/min (by C-G formula based on SCr of 0.6 mg/dL). Liver Function Tests: Recent Labs  Lab 06/22/19 0849 06/22/19 2204 06/23/19 0549  AST 164* 148* 122*  ALT 349* 318* 274*  ALKPHOS 141* 151* 130*  BILITOT 4.1* 5.2* 4.4*  PROT 7.4 7.6 6.8  ALBUMIN 3.7 3.9  3.9 3.4*   Recent Labs  Lab 06/22/19 0849 06/23/19 0549  LIPASE 7,228* 372*   No results for input(s): AMMONIA in the last 168 hours. Coagulation Profile: No results for input(s): INR, PROTIME in the last 168 hours. Cardiac Enzymes: No results for input(s): CKTOTAL, CKMB, CKMBINDEX, TROPONINI in the last 168 hours. BNP (last 3 results) No results for input(s): PROBNP in the last 8760 hours. HbA1C: No results for input(s): HGBA1C in the last 72 hours. CBG: No results for input(s): GLUCAP in the last 168 hours. Lipid Profile: No results for input(s): CHOL, HDL, LDLCALC, TRIG, CHOLHDL, LDLDIRECT in the last 72 hours. Thyroid Function Tests: No results for input(s): TSH, T4TOTAL, FREET4, T3FREE, THYROIDAB in the last 72 hours. Anemia Panel: No results for input(s): VITAMINB12, FOLATE, FERRITIN, TIBC, IRON, RETICCTPCT in the last 72 hours. Sepsis Labs: Recent Labs  Lab 06/22/19 2204  LATICACIDVEN 1.4    Recent Results (from the past 240 hour(s))  SARS Coronavirus 2 by RT PCR (hospital order, performed in [facility] hospital lab) Nasopharyngeal Nasopharyngeal Swab     Status: None   Collection Time: 06/22/19 11:13 AM   Specimen: Nasopharyngeal Swab  Result Value Ref Range Status   SARS Coronavirus 2 NEGATIVE NEGATIVE Final    Comment: (NOTE) SARS-CoV-2 target nucleic acids are NOT DETECTED.  The SARS-CoV-2 RNA is  generally detectable in upper and lower respiratory specimens during the acute phase of infection. The lowest concentration of SARS-CoV-2 viral copies this assay can detect is 250 copies / mL. A negative result does not preclude SARS-CoV-2 infection and should not be used as the sole basis for treatment or other patient management decisions.  A negative result may occur with improper specimen collection / handling, submission of specimen other than nasopharyngeal swab, presence of viral mutation(s) within the areas targeted by this assay, and inadequate number of viral copies (<250 copies / mL). A negative result must be combined with clinical observations, patient history, and epidemiological information.  Fact Sheet for Patients:   BoilerBrush.com.cy  Fact Sheet for Healthcare Providers: https://pope.com/  This test is not yet approved or  cleared by the [place] FDA and has been authorized for detection and/or diagnosis of SARS-CoV-2 by FDA under an Emergency Use Authorization (EUA).  This EUA will remain in effect (meaning this test can be used) for the duration of the COVID-19 declaration under Section 564(b)(1) of the  Act, 21 U.S.C. section 360bbb-3(b)(1), unless the authorization is terminated or revoked sooner.  Performed at [facility], [address] [place], [place] [address]          Radiology Studies: CT ABDOMEN PELVIS W CONTRAST  Result Date: 06/22/2019 CLINICAL DATA:  Pancreatitis.  Epigastric pain. EXAM: CT ABDOMEN AND PELVIS WITH CONTRAST TECHNIQUE: Multidetector CT imaging of the abdomen and pelvis was performed using the standard protocol following bolus administration of intravenous contrast. CONTRAST:  OMNIPAQUE IOHEXOL 300 MG/ML  SOLN COMPARISON:  Abdominal ultrasound yesterday. FINDINGS: Lower chest: Lung bases are clear.  No pleural fluid. Hepatobiliary: Diffusely decreased hepatic  density consistent with steatosis. Ill-defined central hypodense area measuring 2 cm common series 2, image 18, may represent more focal fatty infiltration versus underlying lesion. Gallstones on ultrasound are not well-defined by CT. Common bile duct appears mildly dilated measuring 8 mm. No intrahepatic biliary ductal dilatation. Pancreas: Moderate peripancreatic fat stranding about the distal body and tail. No ductal dilatation. Homogeneous enhancement without necrosis. No acute peripancreatic collection. No evidence of underlying pancreatic mass. Spleen: Normal in size without focal abnormality. Splenule at the hilum. Adrenals/Urinary Tract: Normal adrenal glands. No hydronephrosis or perinephric edema. Homogeneous renal enhancement. Urinary bladder is physiologically distended without wall thickening. Stomach/Bowel: Stomach and duodenum are unremarkable. No small bowel obstruction or dilatation. There is slight fecalization of distal small bowel contents. Normal appendix. Small to moderate volume of stool throughout the colon. No colonic wall thickening. Vascular/Lymphatic: Patent portal and splenic veins. Patent mesenteric veins. Normal caliber abdominal aorta. Few prominent central mesenteric nodes are likely reactive. Reproductive: Physiologic corpus luteal cyst on the right. No adnexal mass. Uterus is unremarkable. Other: Peripancreatic fat stranding with minimal adjacent fluid. No organized fluid collection. No ascites elsewhere. No free air. Small fat containing umbilical hernia. Musculoskeletal: There are no acute or suspicious osseous abnormalities. Degenerative change of the pubic symphysis. IMPRESSION: 1. Acute edematous pancreatitis. No acute peripancreatic collection or pancreatic necrosis. 2. Hepatic steatosis. Ill-defined central hypodense area measuring 2 cm may represent more focal fatty infiltration versus underlying lesion. 3. Mild common bile duct dilatation measuring 8 mm. No intrahepatic  biliary ductal dilatation. Gallstones on ultrasound are not well-defined by CT. 4. Small fat containing umbilical hernia. Electronically Signed   By: Narda Rutherford M.D.   On: 06/22/2019 22:55        Scheduled Meds: . pantoprazole (PROTONIX) IV  40 mg Intravenous Q24H   Continuous Infusions: .  ceFAZolin (ANCEF) IV    . lactated ringers 125 mL/hr at 06/23/19 0656     LOS: 1 day   Time spent= 35 mins    Damarrion Mimbs Joline Maxcy, MD [facility]  If 7PM-7AM, please contact night-coverage  06/23/2019, 8:18 AM

## 2019-06-23 NOTE — Consult Note (Signed)
Surgical Evaluation Requesting provider: Dr. Criss Alvine  Chief Complaint: abdominal pain  HPI: Otherwise health 33yo woman who transferred from [facility] last night with acute pancreatitis. She has had epigastric pain and nausea for about 10 days leading up to presentation, but endorses a 1 year history of episodic RUQ/epigastric pain about 1 hour after meals. The pain she had with this episode was severe and sharp, waxing and waning but unrelenting, radiating to the shoulder blades  and associated with nausea and bilious emesis. She also endorses occasional watery diarrhea.  She was evaluated in [place] earlier this week, and an [place] was performed demonstrating cholelithiasis (report in care everywhere). Due to worsening pain she presented to [facility] yesterday and was found to have a lipase of 7228, Tbili 4.1, WBC 8.  No Known Allergies  Past Medical History:  Diagnosis Date  . Class 3 severe obesity due to excess calories without serious comorbidity with body mass index (BMI) of 40.0 to 44.9 in adult [facility]) 06/22/2019    Reports surgical history of C-section 1 year ago.  Family History  Problem Relation Age of Onset  . Other Neg Hx     Social History   Socioeconomic History  . Marital status: Married    Spouse name: Not on file  . Number of children: Not on file  . Years of education: Not on file  . Highest education level: Not on file  Occupational History  . Not on file  Tobacco Use  . Smoking status: Never Smoker  . Smokeless tobacco: Never Used  Substance and Sexual Activity  . Alcohol use: Not Currently  . Drug use: Not Currently  . Sexual activity: Not on file  Other Topics Concern  . Not on file  Social History Narrative  . Not on file   Social Determinants of Health   Financial Resource Strain:   . Difficulty of Paying Living Expenses:   Food Insecurity:   . Worried About Programme researcher, broadcasting/film/video in the Last Year:   . Barista in the Last Year:   Transportation  Needs:   . Freight forwarder (Medical):   Marland Kitchen Lack of Transportation (Non-Medical):   Physical Activity:   . Days of Exercise per Week:   . Minutes of Exercise per Session:   Stress:   . Feeling of Stress :   Social Connections:   . Frequency of Communication with Friends and Family:   . Frequency of Social Gatherings with Friends and Family:   . Attends Religious Services:   . Active Member of Clubs or Organizations:   . Attends Banker Meetings:   Marland Kitchen Marital Status:     No current facility-administered medications on file prior to encounter.   Current Outpatient Medications on File Prior to Encounter  Medication Sig Dispense Refill  . HYDROcodone-acetaminophen (NORCO/VICODIN) 5-325 MG tablet Take 1 tablet by mouth every 6 (six) hours as needed for moderate pain or severe pain.     . Multiple Vitamins-Minerals (MULTIVITAMIN ADULT) CHEW Chew 1 tablet by mouth daily.    . ondansetron (ZOFRAN-ODT) 4 MG disintegrating tablet Take 4 mg by mouth every 8 (eight) hours as needed for nausea or vomiting.       Review of Systems: a complete, 10pt review of systems was completed with pertinent positives and negatives as documented in the HPI  Physical Exam: Vitals:   06/23/19 0028 06/23/19 0423  BP: 111/70 115/63  Pulse: 61 89  Resp: 16 16  Temp:  98.1 F (36.7 C) 98.5 F (36.9 C)  SpO2: 97% 95%   Gen: A&Ox3, no distress  Eyes: lids and conjunctivae normal. Pupils equally round and reactive to light.  Neck: supple without mass or thyromegaly Chest: respiratory effort is normal. No crepitus or tenderness on palpation of the chest. Breath sounds equal.  Cardiovascular: RRR with palpable distal pulses, no pedal edema Gastrointestinal: soft, nondistended, mildly tender in RUQ. No mass, hepatomegaly or splenomegaly. No hernia.. Neuro: cranial nerves grossly intact.  Sensation intact to light touch diffusely. Psych: appropriate mood and affect, normal insight/judgment  intact  Skin: warm and dry   CBC Latest Ref Rng & Units 06/22/2019 10/09/2017  WBC 4.0 - 10.5 K/uL 8.0 6.5  Hemoglobin 12.0 - 15.0 g/dL 13.3 13.3  Hematocrit 36 - 46 % 42.1 39.2  Platelets 150 - 400 K/uL 288 265    CMP Latest Ref Rng & Units 06/22/2019 06/22/2019  Glucose 70 - 99 mg/dL 129(H) 164(H)  BUN 6 - 20 mg/dL 7 8  Creatinine 0.44 - 1.00 mg/dL 0.58 0.84  Sodium 135 - 145 mmol/L 135 134(L)  Potassium 3.5 - 5.1 mmol/L 3.9 3.3(L)  Chloride 98 - 111 mmol/L 97(L) 100  CO2 22 - 32 mmol/L 25 23  Calcium 8.9 - 10.3 mg/dL 9.2 9.0  Total Protein 6.5 - 8.1 g/dL 7.6 7.4  Total Bilirubin 0.3 - 1.2 mg/dL 5.2(H) 4.1(H)  Alkaline Phos 38 - 126 U/L 151(H) 141(H)  AST 15 - 41 U/L 148(H) 164(H)  ALT 0 - 44 U/L 318(H) 349(H)    No results found for: INR, PROTIME  Imaging: CT ABDOMEN PELVIS W CONTRAST  Result Date: 06/22/2019 CLINICAL DATA:  Pancreatitis.  Epigastric pain. EXAM: CT ABDOMEN AND PELVIS WITH CONTRAST TECHNIQUE: Multidetector CT imaging of the abdomen and pelvis was performed using the standard protocol following bolus administration of intravenous contrast. CONTRAST:  183mL OMNIPAQUE IOHEXOL 300 MG/ML  SOLN COMPARISON:  Abdominal ultrasound yesterday. FINDINGS: Lower chest: Lung bases are clear.  No pleural fluid. Hepatobiliary: Diffusely decreased hepatic density consistent with steatosis. Ill-defined central hypodense area measuring 2 cm common series 2, image 18, may represent more focal fatty infiltration versus underlying lesion. Gallstones on ultrasound are not well-defined by CT. Common bile duct appears mildly dilated measuring 8 mm. No intrahepatic biliary ductal dilatation. Pancreas: Moderate peripancreatic fat stranding about the distal body and tail. No ductal dilatation. Homogeneous enhancement without necrosis. No acute peripancreatic collection. No evidence of underlying pancreatic mass. Spleen: Normal in size without focal abnormality. Splenule at the hilum.  Adrenals/Urinary Tract: Normal adrenal glands. No hydronephrosis or perinephric edema. Homogeneous renal enhancement. Urinary bladder is physiologically distended without wall thickening. Stomach/Bowel: Stomach and duodenum are unremarkable. No small bowel obstruction or dilatation. There is slight fecalization of distal small bowel contents. Normal appendix. Small to moderate volume of stool throughout the colon. No colonic wall thickening. Vascular/Lymphatic: Patent portal and splenic veins. Patent mesenteric veins. Normal caliber abdominal aorta. Few prominent central mesenteric nodes are likely reactive. Reproductive: Physiologic corpus luteal cyst on the right. No adnexal mass. Uterus is unremarkable. Other: Peripancreatic fat stranding with minimal adjacent fluid. No organized fluid collection. No ascites elsewhere. No free air. Small fat containing umbilical hernia. Musculoskeletal: There are no acute or suspicious osseous abnormalities. Degenerative change of the pubic symphysis. IMPRESSION: 1. Acute edematous pancreatitis. No acute peripancreatic collection or pancreatic necrosis. 2. Hepatic steatosis. Ill-defined central hypodense area measuring 2 cm may represent more focal fatty infiltration versus underlying lesion. 3. Mild common  bile duct dilatation measuring 8 mm. No intrahepatic biliary ductal dilatation. Gallstones on ultrasound are not well-defined by CT. 4. Small fat containing umbilical hernia. Electronically Signed   By: Narda Rutherford M.D.   On: 06/22/2019 22:55     A/P: Acute gallstone pancreatitis. Pain is improved this morning but still nauseated. Labs pending this morning. We discussed the relevant anatomy and pathology. I recommend proceeding with laparoscopic cholecystectomy with possible cholangiogram this admission pending resolution of pancreatitis/ hyperbilirubinemia. Discussed technique and risks of surgery including bleeding, pain, scarring, intraabdominal injury specifically  to the common bile duct and sequelae, bile leak, conversion to open surgery, blood clot, pneumonia, heart attack, stroke, failure to resolve symptoms, etc. Questions welcomed and answered. Depending on clinical course, may be ready as early as Sunday.     Patient Active Problem List   Diagnosis Date Noted  . Acute gallstone pancreatitis 06/22/2019  . Dehydration with hyponatremia 06/22/2019  . Hyperglycemia 06/22/2019  . Class 3 severe obesity due to excess calories without serious comorbidity with body mass index (BMI) of 40.0 to 44.9 in adult (HCC) 06/22/2019  . GERD without esophagitis 06/22/2019  . Hypokalemia due to excessive gastrointestinal loss of potassium 06/22/2019       Phylliss Blakes, MD [facility] [place]  See AMION to contact appropriate on-call provider

## 2019-06-24 ENCOUNTER — Encounter (HOSPITAL_COMMUNITY): Payer: Self-pay | Admitting: Internal Medicine

## 2019-06-24 ENCOUNTER — Inpatient Hospital Stay (HOSPITAL_COMMUNITY): Payer: 59 | Admitting: Certified Registered Nurse Anesthetist

## 2019-06-24 ENCOUNTER — Inpatient Hospital Stay (HOSPITAL_COMMUNITY): Payer: 59

## 2019-06-24 ENCOUNTER — Encounter (HOSPITAL_COMMUNITY)
Admission: AD | Disposition: A | Discharge status: Home / Self Care | Payer: Self-pay | Source: Home / Self Care | Attending: Internal Medicine

## 2019-06-24 DIAGNOSIS — K851 Biliary acute pancreatitis without necrosis or infection: Principal | ICD-10-CM

## 2019-06-24 DIAGNOSIS — R932 Abnormal findings on diagnostic imaging of liver and biliary tract: Secondary | ICD-10-CM

## 2019-06-24 DIAGNOSIS — R7989 Other specified abnormal findings of blood chemistry: Secondary | ICD-10-CM

## 2019-06-24 HISTORY — PX: CHOLECYSTECTOMY: SHX55

## 2019-06-24 LAB — COMPREHENSIVE METABOLIC PANEL
ALT: 243 U/L — ABNORMAL HIGH (ref 0–44)
ALT: 279 U/L — ABNORMAL HIGH (ref 0–44)
AST: 114 U/L — ABNORMAL HIGH (ref 15–41)
AST: 197 U/L — ABNORMAL HIGH (ref 15–41)
Albumin: 3.6 g/dL (ref 3.5–5.0)
Albumin: 3.5 g/dL (ref 3.5–5.0)
Alkaline Phosphatase: 153 U/L — ABNORMAL HIGH (ref 38–126)
Alkaline Phosphatase: 158 U/L — ABNORMAL HIGH (ref 38–126)
Anion gap: 13 (ref 5–15)
Anion gap: 13 (ref 5–15)
BUN: 6 mg/dL (ref 6–20)
BUN: 6 mg/dL (ref 6–20)
CO2: 22 mmol/L (ref 22–32)
CO2: 23 mmol/L (ref 22–32)
Calcium: 9.3 mg/dL (ref 8.9–10.3)
Calcium: 9.2 mg/dL (ref 8.9–10.3)
Chloride: 99 mmol/L (ref 98–111)
Chloride: 99 mmol/L (ref 98–111)
Creatinine, Ser: 0.67 mg/dL (ref 0.44–1.00)
Creatinine, Ser: 0.61 mg/dL (ref 0.44–1.00)
GFR calc Af Amer: >60 mL/min (ref >60)
GFR calc Af Amer: >60 mL/min (ref >60)
GFR calc non Af Amer: >60 mL/min (ref >60)
GFR calc non Af Amer: >60 mL/min (ref >60)
Glucose, Bld: 110 mg/dL — ABNORMAL HIGH (ref 70–99)
Glucose, Bld: 129 mg/dL — ABNORMAL HIGH (ref 70–99)
Potassium: 3.7 mmol/L (ref 3.5–5.1)
Potassium: 3.7 mmol/L (ref 3.5–5.1)
Sodium: 134 mmol/L — ABNORMAL LOW (ref 135–145)
Sodium: 135 mmol/L (ref 135–145)
Total Bilirubin: 5.8 mg/dL — ABNORMAL HIGH (ref 0.3–1.2)
Total Bilirubin: 5.6 mg/dL — ABNORMAL HIGH (ref 0.3–1.2)
Total Protein: 7.4 g/dL (ref 6.5–8.1)
Total Protein: 7.4 g/dL (ref 6.5–8.1)

## 2019-06-24 LAB — CBC
HCT: 40.9 % (ref 36.0–46.0)
Hemoglobin: 13 g/dL (ref 12.0–15.0)
MCH: 28.1 pg (ref 26.0–34.0)
MCHC: 31.8 g/dL (ref 30.0–36.0)
MCV: 88.5 fL (ref 80.0–100.0)
Platelets: 299 10*3/uL (ref 150–400)
RBC: 4.62 MIL/uL (ref 3.87–5.11)
RDW: 13.8 % (ref 11.5–15.5)
WBC: 8.8 10*3/uL (ref 4.0–10.5)
nRBC: 0 % (ref 0.0–0.2)

## 2019-06-24 LAB — MAGNESIUM: Magnesium: 1.8 mg/dL (ref 1.7–2.4)

## 2019-06-24 LAB — LIPASE, BLOOD: Lipase: 1184 U/L — ABNORMAL HIGH (ref 11–51)

## 2019-06-24 LAB — GLUCOSE, CAPILLARY: Glucose-Capillary: 106 mg/dL — ABNORMAL HIGH (ref 70–99)

## 2019-06-24 SURGERY — LAPAROSCOPIC CHOLECYSTECTOMY WITH INTRAOPERATIVE CHOLANGIOGRAM
Anesthesia: General | Site: Abdomen

## 2019-06-24 MED ORDER — ACETAMINOPHEN 500 MG PO TABS
1000.0000 mg | ORAL_TABLET | Freq: Once | ORAL | Status: AC
  Administered 2019-06-24: 1000 mg via ORAL

## 2019-06-24 MED ORDER — BUPIVACAINE HCL 0.25 % IJ SOLN
INTRAMUSCULAR | Status: AC
  Filled 2019-06-24: qty 1

## 2019-06-24 MED ORDER — SCOPOLAMINE 1 MG/3DAYS TD PT72
MEDICATED_PATCH | TRANSDERMAL | Status: AC
  Filled 2019-06-24: qty 1

## 2019-06-24 MED ORDER — FENTANYL CITRATE (PF) 100 MCG/2ML IJ SOLN
INTRAMUSCULAR | Status: AC
  Filled 2019-06-24: qty 2

## 2019-06-24 MED ORDER — DEXAMETHASONE SODIUM PHOSPHATE 10 MG/ML IJ SOLN
INTRAMUSCULAR | Status: DC | PRN
  Administered 2019-06-24: 5 mg via INTRAVENOUS

## 2019-06-24 MED ORDER — LACTATED RINGERS IV SOLN: INTRAVENOUS | Status: DC | PRN

## 2019-06-24 MED ORDER — SUGAMMADEX SODIUM 200 MG/2ML IV SOLN
INTRAVENOUS | Status: DC | PRN
  Administered 2019-06-24: 200 mg via INTRAVENOUS

## 2019-06-24 MED ORDER — ACETAMINOPHEN 500 MG PO TABS
ORAL_TABLET | ORAL | Status: AC
  Filled 2019-06-24: qty 2

## 2019-06-24 MED ORDER — FENTANYL CITRATE (PF) 100 MCG/2ML IJ SOLN
INTRAMUSCULAR | Status: DC | PRN
  Administered 2019-06-24 (×2): 100 ug via INTRAVENOUS
  Administered 2019-06-24: 50 ug via INTRAVENOUS

## 2019-06-24 MED ORDER — TRAMADOL HCL 50 MG PO TABS
50.0000 mg | ORAL_TABLET | Freq: Four times a day (QID) | ORAL | Status: DC | PRN
  Administered 2019-06-25: 50 mg via ORAL
  Filled 2019-06-24: qty 1

## 2019-06-24 MED ORDER — BUPIVACAINE-EPINEPHRINE 0.25% -1:200000 IJ SOLN
INTRAMUSCULAR | Status: DC | PRN
  Administered 2019-06-24: 45 mL

## 2019-06-24 MED ORDER — LIDOCAINE 2% (20 MG/ML) 5 ML SYRINGE
INTRAMUSCULAR | Status: DC | PRN
  Administered 2019-06-24: 50 mg via INTRAVENOUS

## 2019-06-24 MED ORDER — CEFAZOLIN SODIUM-DEXTROSE 2-4 GM/100ML-% IV SOLN
INTRAVENOUS | Status: AC
  Filled 2019-06-24: qty 100

## 2019-06-24 MED ORDER — FENTANYL CITRATE (PF) 100 MCG/2ML IJ SOLN
25.0000 ug | INTRAMUSCULAR | Status: DC | PRN
  Administered 2019-06-24 (×2): 50 ug via INTRAVENOUS

## 2019-06-24 MED ORDER — PHENYLEPHRINE 40 MCG/ML (10ML) SYRINGE FOR IV PUSH (FOR BLOOD PRESSURE SUPPORT)
PREFILLED_SYRINGE | INTRAVENOUS | Status: DC | PRN
  Administered 2019-06-24: 80 ug via INTRAVENOUS

## 2019-06-24 MED ORDER — ONDANSETRON HCL 4 MG/2ML IJ SOLN
INTRAMUSCULAR | Status: AC
  Filled 2019-06-24: qty 2

## 2019-06-24 MED ORDER — MIDAZOLAM HCL 5 MG/5ML IJ SOLN
INTRAMUSCULAR | Status: DC | PRN
  Administered 2019-06-24: 2 mg via INTRAVENOUS

## 2019-06-24 MED ORDER — PROMETHAZINE HCL 25 MG/ML IJ SOLN: 6.2500 mg | INTRAMUSCULAR | Status: DC | PRN

## 2019-06-24 MED ORDER — MIDAZOLAM HCL 2 MG/2ML IJ SOLN
INTRAMUSCULAR | Status: AC
  Filled 2019-06-24: qty 2

## 2019-06-24 MED ORDER — SCOPOLAMINE 1 MG/3DAYS TD PT72
MEDICATED_PATCH | TRANSDERMAL | Status: DC | PRN
  Administered 2019-06-24: 1 via TRANSDERMAL

## 2019-06-24 MED ORDER — IOHEXOL 300 MG/ML  SOLN
INTRAMUSCULAR | Status: DC | PRN
  Administered 2019-06-24: 10 mL

## 2019-06-24 MED ORDER — ONDANSETRON HCL 4 MG/2ML IJ SOLN
INTRAMUSCULAR | Status: DC | PRN
  Administered 2019-06-24: 4 mg via INTRAVENOUS

## 2019-06-24 MED ORDER — LACTATED RINGERS IV SOLN
INTRAVENOUS | Status: AC | PRN
  Administered 2019-06-24: 1000 mL

## 2019-06-24 MED ORDER — PROPOFOL 10 MG/ML IV BOLUS
INTRAVENOUS | Status: DC | PRN
  Administered 2019-06-24: 200 mg via INTRAVENOUS

## 2019-06-24 MED ORDER — FENTANYL CITRATE (PF) 250 MCG/5ML IJ SOLN
INTRAMUSCULAR | Status: AC
  Filled 2019-06-24: qty 5

## 2019-06-24 MED ORDER — 0.9 % SODIUM CHLORIDE (POUR BTL) OPTIME
TOPICAL | Status: DC | PRN
  Administered 2019-06-24: 1000 mL

## 2019-06-24 MED ORDER — PROPOFOL 10 MG/ML IV BOLUS
INTRAVENOUS | Status: AC
  Filled 2019-06-24: qty 20

## 2019-06-24 MED ORDER — ROCURONIUM BROMIDE 10 MG/ML (PF) SYRINGE
PREFILLED_SYRINGE | INTRAVENOUS | Status: DC | PRN
  Administered 2019-06-24: 10 mg via INTRAVENOUS
  Administered 2019-06-24: 50 mg via INTRAVENOUS

## 2019-06-24 SURGICAL SUPPLY — 36 items
APPLIER CLIP ROT 10 11.4 M/L (STAPLE) ×3
CABLE HIGH FREQUENCY MONO STRZ (ELECTRODE) ×3 IMPLANT
CHLORAPREP W/TINT 26 (MISCELLANEOUS) ×3 IMPLANT
CLIP APPLIE ROT 10 11.4 M/L (STAPLE) ×1 IMPLANT
COVER MAYO STAND STRL (DRAPES) ×3 IMPLANT
COVER SURGICAL LIGHT HANDLE (MISCELLANEOUS) ×3 IMPLANT
COVER WAND RF STERILE (DRAPES) IMPLANT
DECANTER SPIKE VIAL GLASS SM (MISCELLANEOUS) ×3 IMPLANT
DERMABOND ADVANCED (GAUZE/BANDAGES/DRESSINGS) ×2
DERMABOND ADVANCED .7 DNX12 (GAUZE/BANDAGES/DRESSINGS) ×1 IMPLANT
DRAPE C-ARM 42X120 X-RAY (DRAPES) ×3 IMPLANT
ELECT REM PT RETURN 15FT ADLT (MISCELLANEOUS) ×3 IMPLANT
ENDOLOOP SUT PDS II  0 18 (SUTURE) ×2
ENDOLOOP SUT PDS II 0 18 (SUTURE) ×1 IMPLANT
GLOVE BIO SURGEON STRL SZ 6 (GLOVE) ×3 IMPLANT
GLOVE INDICATOR 6.5 STRL GRN (GLOVE) ×3 IMPLANT
GOWN STRL REUS W/TWL LRG LVL3 (GOWN DISPOSABLE) ×3 IMPLANT
GOWN STRL REUS W/TWL XL LVL3 (GOWN DISPOSABLE) ×6 IMPLANT
GRASPER SUT TROCAR 14GX15 (MISCELLANEOUS) IMPLANT
HEMOSTAT SNOW SURGICEL 2X4 (HEMOSTASIS) IMPLANT
KIT BASIN (CUSTOM PROCEDURE TRAY) ×3 IMPLANT
KIT TURNOVER KIT A (KITS) IMPLANT
NEEDLE INSUFFLATION 14GA 120MM (NEEDLE) ×3 IMPLANT
PENCIL SMOKE EVACUATOR (MISCELLANEOUS) IMPLANT
POUCH SPECIMEN RETRIEVAL 10MM (ENDOMECHANICALS) ×3 IMPLANT
SCISSORS LAP 5X35 DISP (ENDOMECHANICALS) ×3 IMPLANT
SET CHOLANGIOGRAPH MIX (MISCELLANEOUS) ×3 IMPLANT
SET IRRIG TUBING LAPAROSCOPIC (IRRIGATION / IRRIGATOR) ×3 IMPLANT
SET TUBE SMOKE EVAC HIGH FLOW (TUBING) ×3 IMPLANT
SLEEVE XCEL OPT CAN 5 100 (ENDOMECHANICALS) ×6 IMPLANT
SUT MNCRL AB 4-0 PS2 18 (SUTURE) ×3 IMPLANT
TOWEL OR 17X26 10 PK STRL BLUE (TOWEL DISPOSABLE) ×3 IMPLANT
TOWEL OR NON WOVEN STRL DISP B (DISPOSABLE) ×3 IMPLANT
TRAY LAPAROSCOPIC (CUSTOM PROCEDURE TRAY) ×3 IMPLANT
TROCAR BLADELESS OPT 5 100 (ENDOMECHANICALS) ×3 IMPLANT
TROCAR XCEL 12X100 BLDLESS (ENDOMECHANICALS) ×3 IMPLANT

## 2019-06-24 NOTE — Transfer of Care (Signed)
Immediate Anesthesia Transfer of Care Note  Patient: Veronica Randall  Procedure(s) Performed: LAPAROSCOPIC CHOLECYSTECTOMY WITH INTRAOPERATIVE CHOLANGIOGRAM (N/A Abdomen)  Patient Location: PACU  Anesthesia Type:General  Level of Consciousness: awake, alert  and oriented  Airway & Oxygen Therapy: Patient Spontanous Breathing and Patient connected to face mask oxygen  Post-op Assessment: Report given to RN and Post -op Vital signs reviewed and stable  Post vital signs: Reviewed and stable  Last Vitals:  Vitals Value Taken Time  BP 121/78 06/24/19 0951  Temp    Pulse 82 06/24/19 0951  Resp 25 06/24/19 0951  SpO2 100 % 06/24/19 0951  Vitals shown include unvalidated device data.  Last Pain:  Vitals:   06/24/19 0543  TempSrc: Oral  PainSc:          Complications: No complications documented.

## 2019-06-24 NOTE — Progress Notes (Addendum)
Day of Surgery   Subjective/Chief Complaint: No new issues, no pain since yesterday. Nausea last night.    Objective: Vital signs in last 24 hours: Temp:  [97.7 F (36.5 C)-99.1 F (37.3 C)] 98.5 F (36.9 C) (06/13 0543) Pulse Rate:  [75-92] 87 (06/13 0543) Resp:  [15-18] 18 (06/13 0543) BP: (95-125)/(58-88) 125/72 (06/13 0543) SpO2:  [93 %-98 %] 98 % (06/13 0543) Last BM Date: 06/22/19  Intake/Output from previous day: 06/12 0701 - 06/13 0700 In: 2360 [P.O.:360; I.V.:2000] Out: -  Intake/Output this shift: No intake/output data recorded.  General appearance: alert and cooperative Resp: unlabored GI: soft, mildly tender in RUQ  Lab Results:  Recent Labs    06/23/19 0549 06/24/19 0600  WBC 7.9 8.8  HGB 12.6 13.0  HCT 38.9 40.9  PLT 285 299   BMET Recent Labs    06/23/19 0549 06/24/19 0600  NA 135 134*  K 3.8 3.7  CL 101 99  CO2 23 22  GLUCOSE 112* 110*  BUN 7 6  CREATININE 0.60 0.67  CALCIUM 9.1 9.3   PT/INR No results for input(s): LABPROT, INR in the last 72 hours. ABG No results for input(s): PHART, HCO3 in the last 72 hours.  Invalid input(s): PCO2, PO2  Studies/Results: CT ABDOMEN PELVIS W CONTRAST  Result Date: 06/22/2019 CLINICAL DATA:  Pancreatitis.  Epigastric pain. EXAM: CT ABDOMEN AND PELVIS WITH CONTRAST TECHNIQUE: Multidetector CT imaging of the abdomen and pelvis was performed using the standard protocol following bolus administration of intravenous contrast. CONTRAST:  OMNIPAQUE IOHEXOL 300 MG/ML  SOLN COMPARISON:  Abdominal ultrasound yesterday. FINDINGS: Lower chest: Lung bases are clear.  No pleural fluid. Hepatobiliary: Diffusely decreased hepatic density consistent with steatosis. Ill-defined central hypodense area measuring 2 cm common series 2, image 18, may represent more focal fatty infiltration versus underlying lesion. Gallstones on ultrasound are not well-defined by CT. Common bile duct appears mildly dilated measuring 8  mm. No intrahepatic biliary ductal dilatation. Pancreas: Moderate peripancreatic fat stranding about the distal body and tail. No ductal dilatation. Homogeneous enhancement without necrosis. No acute peripancreatic collection. No evidence of underlying pancreatic mass. Spleen: Normal in size without focal abnormality. Splenule at the hilum. Adrenals/Urinary Tract: Normal adrenal glands. No hydronephrosis or perinephric edema. Homogeneous renal enhancement. Urinary bladder is physiologically distended without wall thickening. Stomach/Bowel: Stomach and duodenum are unremarkable. No small bowel obstruction or dilatation. There is slight fecalization of distal small bowel contents. Normal appendix. Small to moderate volume of stool throughout the colon. No colonic wall thickening. Vascular/Lymphatic: Patent portal and splenic veins. Patent mesenteric veins. Normal caliber abdominal aorta. Few prominent central mesenteric nodes are likely reactive. Reproductive: Physiologic corpus luteal cyst on the right. No adnexal mass. Uterus is unremarkable. Other: Peripancreatic fat stranding with minimal adjacent fluid. No organized fluid collection. No ascites elsewhere. No free air. Small fat containing umbilical hernia. Musculoskeletal: There are no acute or suspicious osseous abnormalities. Degenerative change of the pubic symphysis. IMPRESSION: 1. Acute edematous pancreatitis. No acute peripancreatic collection or pancreatic necrosis. 2. Hepatic steatosis. Ill-defined central hypodense area measuring 2 cm may represent more focal fatty infiltration versus underlying lesion. 3. Mild common bile duct dilatation measuring 8 mm. No intrahepatic biliary ductal dilatation. Gallstones on ultrasound are not well-defined by CT. 4. Small fat containing umbilical hernia. Electronically Signed   By: Narda Rutherford M.D.   On: 06/22/2019 22:55    Anti-infectives: Anti-infectives (From admission, onward)   Start     Dose/Rate Route  Frequency Ordered  Stop   06/24/19 0600  ceFAZolin (ANCEF) IVPB 2g/100 mL premix     Discontinue     2 g 200 mL/hr over 30 Minutes Intravenous On call to O.R. 06/23/19 0734 06/25/19 0559      Assessment/Plan:  Gallstone pancreatitis. Lipase rapidly decreased yesterday, still pending today. Bilirubin has gone up, suggesting she may still have choledocholithiasis. No fever or leukocytosis (has not been on any antibiotics) to suggest cholangitis. Will proceed with laparoscopic cholecystectomy with cholangiogram this morning. We discussed The procedure and risks of surgery and her questions have been answered. Discussed if choledocholithiasis is identified we will contact GI for ERCP.    LOS: 2 days    Clovis Riley 06/24/2019

## 2019-06-24 NOTE — Op Note (Addendum)
Operative Note  Veronica Randall 34 y.o. female [id number]  06/24/2019  Surgeon: Berna Bue MD FACS  Assistant: Estelle Grumbles MD FACS  Procedure performed: Laparoscopic Cholecystectomy with cholangiogram  Procedure classification: Urgent  Preop diagnosis: Acute cholecystitis, gallstone pancreatitis Post-op diagnosis/intraop findings: same, choledocholithiasis  Specimens: gallbladder  Retained items: none  EBL: minimal  Complications: none  Description of procedure: After obtaining informed consent the patient was brought to the operating room. Antibiotics were administered. SCD's were applied. General endotracheal anesthesia was initiated and a formal time-out was performed. The abdomen was prepped and draped in the usual sterile fashion and the abdomen was entered using an infraumbilical veress needle after instilling the site with local. Insufflation to was obtained, 86mm trocar and camera inserted, and gross inspection revealed no evidence of injury from our entry or other intraabdominal abnormalities. Two 76mm trocars were introduced in the right midclavicular and right anterior axillary lines under direct visualization and following infiltration with local. An 34mm trocar was placed in the epigastrium. The gallbladder was retracted cephalad and the infundibulum was retracted laterally.  Omental adhesions to the gallbladder were taken down bluntly and with cautery.  The gallbladder did appear acutely inflamed.  A combination of hook electrocautery and blunt dissection was utilized to clear the peritoneum from the neck and cystic duct, circumferentially isolating the cystic artery and cystic duct and lifting the gallbladder from the cystic plate. The critical view of safety was achieved with the cystic artery, cystic duct, and liver bed visualized between them with no other structures. The artery was clipped with a single clip proximally and distally and divided.  A clip was placed on  the distal cystic duct and a ductotomy made.  Inspissated stones were milked out from the proximal cystic duct and the cholangiogram catheter was then inserted and secured with a clip.  Cholangiogram was performed showing filling defect at the distal common bile duct.  The cholangiogram catheter and securing clips were then removed.  2 clips were placed along the proximal cystic duct.  The duct was patulous and these did not completely go across.  The cystic duct was then completely divided and a PDS Endoloop placed proximal to the 2 clips.  This ligated the proximal cystic duct well.  The gallbladder was dissected from the liver plate using electrocautery. Once freed the gallbladder was placed in an endocatch bag and removed through the epigastric trocar site. A small amount of bleeding on the liver bed was controlled with cautery. Some bile and small stones had been spilled from the gallbladder during its dissection from the liver bed. This was aspirated and the right upper quadrant was irrigated copiously until the effluent was clear, all spilled stones retrieved. Hemostasis was once again confirmed, and reinspection of the abdomen revealed no injuries. The clips and Endoloop were well opposed without any bile leak from the duct or the liver bed. The 18mm trocar site in the epigastrium was closed with a 0 vicryl in the fascia under direct visualization using a PMI device. The abdomen was desufflated and all trocars removed. The skin incisions were closed with subcuticular monocryl and Dermabond. The patient was awakened, extubated and transported to the recovery room in stable condition.    All counts were correct at the completion of the case.   Call placed to [facility] at end of case.

## 2019-06-24 NOTE — Progress Notes (Signed)
PROGRESS NOTE    Veronica Randall  DTO:[id number] DOB: Jul 27, 1985 DOA: 06/22/2019 PCP: Patient, No Pcp Per   Brief Narrative:  34 year old with no significant medical history presents to the hospital with complaints of abdominal pain found to have gallstone pancreatitis.  Had transaminitis with elevated total bilirubin.  General surgery consulted. Patient is s/p laparoscopic cholecystectomy with intraoperative cholangiogram on 6/13 byt Dr. Fredricka Bonine. Her pain is adequately controlled.    Assessment & Plan:   Principal Problem:   Acute gallstone pancreatitis Active Problems:   Dehydration with hyponatremia   Hyperglycemia   Class 3 severe obesity due to excess calories without serious comorbidity with body mass index (BMI) of 40.0 to 44.9 in adult [facility])   GERD without esophagitis   Hypokalemia due to excessive gastrointestinal loss of potassium  Acute gallstone pancreatitis Mild to moderate dehydration with hyponatremia -Trend LFTs and lipase levels.  lipase has significantly trended down. - aggressive IV fluids.  Pain control, bowel regimen, antiemetics, Diet to be advanced per Surgery. -Monitor electrolytes. -accuchecks every 6 hours while n.p.o.  Hyperglycemia -Hemoglobin A1 of 5.8%, she is in the prediabetic range.   Obesity with BMI greater than 40 -Outpatient follow-up with PCP, recommend weight loss diet and exercise  GERD without esophagitis -Continue PPI    DVT prophylaxis: Subcu heparin Code Status: Full code Family Communication: family at the bedside  Status is: Inpatient  Remains inpatient appropriate because:IV treatments appropriate due to intensity of illness or inability to take PO   Dispo: The patient is from: Home              Anticipated d/c is to: Home              Anticipated d/c date is: 2 days              Patient currently is not medically stable to d/c.  Awaiting abdominal pain to improve along with pancreatitis, eventually may need laparoscopic  cholecystectomy likely next 1-2 days.  Defer this to general surgery.    Subjective: She reports having some generalized gas pain after the surgery. Denies n/v.   Review of Systems Abdominal pain, no n/v  Examination: Constitutional: Not in acute distress Respiratory: No respiratory distress Cardiovascular: S1 and S2 present, nl rate Abdomen: Mildly distended, tender through out, no guarding, soft Musculoskeletal: No edema noted Skin: No rashes seen Neurologic: A&O x3, able to move UE and LE voluntarily, no facial droop, following commands.  Psychiatric:Appropriate mood and affect Objective: Vitals:   06/24/19 1058 06/24/19 1126 06/24/19 1228 06/24/19 1636  BP: 124/71 126/74 123/78 115/76  Pulse: 76 82 84 76  Resp: 14 16 15 18   Temp: 98.2 F (36.8 C) 98.2 F (36.8 C) 97.7 F (36.5 C) 98.4 F (36.9 C)  TempSrc: Oral Oral Oral Oral  SpO2: 94% 94% 93% 96%  Weight:      Height:        Intake/Output Summary (Last 24 hours) at 06/24/2019 1803 Last data filed at 06/24/2019 1400 Gross per 24 hour  Intake 3465.83 ml  Output 610 ml  Net 2855.83 ml   Filed Weights   06/22/19 0840 06/22/19 2028  Weight: 104 kg 103.5 kg     Data Reviewed:   CBC: Recent Labs  Lab 06/22/19 0849 06/23/19 0549 06/24/19 0600  WBC 8.0 7.9 8.8  NEUTROABS 4.9 5.8  --   HGB 13.3 12.6 13.0  HCT 42.1 38.9 40.9  MCV 87.5 87.0 88.5  PLT 288 285 299  Basic Metabolic Panel: Recent Labs  Lab 06/22/19 0849 06/22/19 2204 06/23/19 0549 06/24/19 0600 06/24/19 1202  NA 134* 135 135 134* 135  K 3.3* 3.9 3.8 3.7 3.7  CL 100 97* 101 99 99  CO2 23 25 23 22 23   GLUCOSE 164* 129* 112* 110* 129*  BUN 8 7 7 6 6   CREATININE 0.84 0.58 0.60 0.67 0.61  CALCIUM 9.0 9.2 9.1 9.3 9.2  MG  --  1.9 1.9 1.8  --   PHOS  --  3.0  --   --   --    GFR: Estimated Creatinine Clearance: 116 mL/min (by C-G formula based on SCr of 0.61 mg/dL). Liver Function Tests: Recent Labs  Lab 06/22/19 0849  06/22/19 2204 06/23/19 0549 06/24/19 0600 06/24/19 1202  AST 164* 148* 122* 114* 197*  ALT 349* 318* 274* 243* 279*  ALKPHOS 141* 151* 130* 153* 158*  BILITOT 4.1* 5.2* 4.4* 5.8* 5.6*  PROT 7.4 7.6 6.8 7.4 7.4  ALBUMIN 3.7 3.9  3.9 3.4* 3.6 3.5   Recent Labs  Lab 06/22/19 0849 06/23/19 0549 06/24/19 0600  LIPASE 7,228* 372* 1,184*   No results for input(s): AMMONIA in the last 168 hours. Coagulation Profile: No results for input(s): INR, PROTIME in the last 168 hours. Cardiac Enzymes: No results for input(s): CKTOTAL, CKMB, CKMBINDEX, TROPONINI in the last 168 hours. BNP (last 3 results) No results for input(s): PROBNP in the last 8760 hours. HbA1C: Recent Labs    06/23/19 0549  HGBA1C 5.8*   CBG: Recent Labs  Lab 06/23/19 1200 06/24/19 0544  GLUCAP 115* 106*   Lipid Profile: No results for input(s): CHOL, HDL, LDLCALC, TRIG, CHOLHDL, LDLDIRECT in the last 72 hours. Thyroid Function Tests: No results for input(s): TSH, T4TOTAL, FREET4, T3FREE, THYROIDAB in the last 72 hours. Anemia Panel: No results for input(s): VITAMINB12, FOLATE, FERRITIN, TIBC, IRON, RETICCTPCT in the last 72 hours. Sepsis Labs: Recent Labs  Lab 06/22/19 2204  LATICACIDVEN 1.4    Recent Results (from the past 240 hour(s))  SARS Coronavirus 2 by RT PCR (hospital order, performed in [facility] hospital lab) Nasopharyngeal Nasopharyngeal Swab     Status: None   Collection Time: 06/22/19 11:13 AM   Specimen: Nasopharyngeal Swab  Result Value Ref Range Status   SARS Coronavirus 2 NEGATIVE NEGATIVE Final    Comment: (NOTE) SARS-CoV-2 target nucleic acids are NOT DETECTED.  The SARS-CoV-2 RNA is generally detectable in upper and lower respiratory specimens during the acute phase of infection. The lowest concentration of SARS-CoV-2 viral copies this assay can detect is 250 copies / mL. A negative result does not preclude SARS-CoV-2 infection and should not be used as the sole basis for  treatment or other patient management decisions.  A negative result may occur with improper specimen collection / handling, submission of specimen other than nasopharyngeal swab, presence of viral mutation(s) within the areas targeted by this assay, and inadequate number of viral copies (<250 copies / mL). A negative result must be combined with clinical observations, patient history, and epidemiological information.  Fact Sheet for Patients:   StrictlyIdeas.no  Fact Sheet for Healthcare Providers: BankingDealers.co.za  This test is not yet approved or  cleared by the [place] FDA and has been authorized for detection and/or diagnosis of SARS-CoV-2 by FDA under an Emergency Use Authorization (EUA).  This EUA will remain in effect (meaning this test can be used) for the duration of the COVID-19 declaration under Section 564(b)(1) of the Act, 21  U.S.C. section 360bbb-3(b)(1), unless the authorization is terminated or revoked sooner.  Performed at [facility], [address] [place], [place] [address]          Radiology Studies: DG Cholangiogram Operative  Result Date: 06/24/2019 CLINICAL DATA:  Cholelithiasis, intraoperative cholangiogram EXAM: INTRAOPERATIVE CHOLANGIOGRAM TECHNIQUE: Cholangiographic images from the C-arm fluoroscopic device were submitted for interpretation post-operatively. Please see the procedural report for the amount of contrast and the fluoroscopy time utilized. COMPARISON:  06/21/2019, 06/22/2019 FINDINGS: Intraoperative cholangiogram performed during the laparoscopic procedure. The residual cystic duct, biliary confluence, common hepatic duct, and common bile duct are patent. However, distal CBD small round filling defect persist on several images. Only a small amount of contrast passes through the distal CBD into the duodenum. Difficult to exclude distal choledocholithiasis. IMPRESSION: Possible  distal CBD choledocholithiasis. Electronically Signed   By: Judie Petit.  Shick M.D.   On: 06/24/2019 11:36   CT ABDOMEN PELVIS W CONTRAST  Result Date: 06/22/2019 CLINICAL DATA:  Pancreatitis.  Epigastric pain. EXAM: CT ABDOMEN AND PELVIS WITH CONTRAST TECHNIQUE: Multidetector CT imaging of the abdomen and pelvis was performed using the standard protocol following bolus administration of intravenous contrast. CONTRAST:  OMNIPAQUE IOHEXOL 300 MG/ML  SOLN COMPARISON:  Abdominal ultrasound yesterday. FINDINGS: Lower chest: Lung bases are clear.  No pleural fluid. Hepatobiliary: Diffusely decreased hepatic density consistent with steatosis. Ill-defined central hypodense area measuring 2 cm common series 2, image 18, may represent more focal fatty infiltration versus underlying lesion. Gallstones on ultrasound are not well-defined by CT. Common bile duct appears mildly dilated measuring 8 mm. No intrahepatic biliary ductal dilatation. Pancreas: Moderate peripancreatic fat stranding about the distal body and tail. No ductal dilatation. Homogeneous enhancement without necrosis. No acute peripancreatic collection. No evidence of underlying pancreatic mass. Spleen: Normal in size without focal abnormality. Splenule at the hilum. Adrenals/Urinary Tract: Normal adrenal glands. No hydronephrosis or perinephric edema. Homogeneous renal enhancement. Urinary bladder is physiologically distended without wall thickening. Stomach/Bowel: Stomach and duodenum are unremarkable. No small bowel obstruction or dilatation. There is slight fecalization of distal small bowel contents. Normal appendix. Small to moderate volume of stool throughout the colon. No colonic wall thickening. Vascular/Lymphatic: Patent portal and splenic veins. Patent mesenteric veins. Normal caliber abdominal aorta. Few prominent central mesenteric nodes are likely reactive. Reproductive: Physiologic corpus luteal cyst on the right. No adnexal mass. Uterus is  unremarkable. Other: Peripancreatic fat stranding with minimal adjacent fluid. No organized fluid collection. No ascites elsewhere. No free air. Small fat containing umbilical hernia. Musculoskeletal: There are no acute or suspicious osseous abnormalities. Degenerative change of the pubic symphysis. IMPRESSION: 1. Acute edematous pancreatitis. No acute peripancreatic collection or pancreatic necrosis. 2. Hepatic steatosis. Ill-defined central hypodense area measuring 2 cm may represent more focal fatty infiltration versus underlying lesion. 3. Mild common bile duct dilatation measuring 8 mm. No intrahepatic biliary ductal dilatation. Gallstones on ultrasound are not well-defined by CT. 4. Small fat containing umbilical hernia. Electronically Signed   By: Narda Rutherford M.D.   On: 06/22/2019 22:55        Scheduled Meds: . acetaminophen      . fentaNYL      . heparin injection (subcutaneous)  5,000 Units Subcutaneous Q8H  . pantoprazole (PROTONIX) IV  40 mg Intravenous Q24H   Continuous Infusions:    LOS: 2 days   Time spent= 35 mins    Ky Barban, MD [facility]  If 7PM-7AM, please contact night-coverage  06/24/2019, 6:03 PM

## 2019-06-24 NOTE — Anesthesia Preprocedure Evaluation (Signed)
Anesthesia Evaluation  Patient identified by MRN, date of birth, ID band Patient awake    Reviewed: Allergy & Precautions, NPO status , Patient's Chart, lab work & pertinent test results  Airway Mallampati: II  TM Distance: >3 FB Neck ROM: Full    Dental  (+) Teeth Intact, Dental Advisory Given   Pulmonary neg pulmonary ROS,    Pulmonary exam normal breath sounds clear to auscultation       Cardiovascular negative cardio ROS Normal cardiovascular exam Rhythm:Regular Rate:Normal     Neuro/Psych negative neurological ROS     GI/Hepatic GERD  ,Gallstone pancreatitis   Endo/Other  Morbid obesity  Renal/GU negative Renal ROS     Musculoskeletal negative musculoskeletal ROS (+)   Abdominal   Peds  Hematology negative hematology ROS (+)   Anesthesia Other Findings Day of surgery medications reviewed with the patient.  Reproductive/Obstetrics negative OB ROS                             Anesthesia Physical Anesthesia Plan  ASA: II  Anesthesia Plan: General   Post-op Pain Management:    Induction: Intravenous  PONV Risk Score and Plan: 4 or greater and Midazolam, Diphenhydramine, Dexamethasone and Ondansetron  Airway Management Planned: Oral ETT  Additional Equipment:   Intra-op Plan:   Post-operative Plan: Extubation in OR  Informed Consent: I have reviewed the patients History and Physical, chart, labs and discussed the procedure including the risks, benefits and alternatives for the proposed anesthesia with the patient or authorized representative who has indicated his/her understanding and acceptance.     Dental advisory given  Plan Discussed with: CRNA  Anesthesia Plan Comments:         Anesthesia Quick Evaluation

## 2019-06-24 NOTE — Anesthesia Postprocedure Evaluation (Signed)
Anesthesia Post Note  Patient: Veronica Randall  Procedure(s) Performed: LAPAROSCOPIC CHOLECYSTECTOMY WITH INTRAOPERATIVE CHOLANGIOGRAM (N/A Abdomen)     Patient location during evaluation: PACU Anesthesia Type: General Level of consciousness: awake and alert Pain management: pain level controlled Vital Signs Assessment: post-procedure vital signs reviewed and stable Respiratory status: spontaneous breathing, nonlabored ventilation, respiratory function stable and patient connected to nasal cannula oxygen Cardiovascular status: blood pressure returned to baseline and stable Postop Assessment: no apparent nausea or vomiting Anesthetic complications: no   No complications documented.  Last Vitals:  Vitals:   06/24/19 1126 06/24/19 1228  BP: 126/74 123/78  Pulse: 82 84  Resp: 16 15  Temp: 36.8 C 36.5 C  SpO2: 94% 93%    Last Pain:  Vitals:   06/24/19 1228  TempSrc: Oral  PainSc:                  Cecile Hearing

## 2019-06-24 NOTE — Consult Note (Addendum)
Referring Provider:  Dr. Phylliss Blakes Primary Care Physician:  Patient, No Pcp Per Primary Gastroenterologist:  Gentry Fitz   Reason for Consultation:  CBD stone/positive IOC  HPI: Veronica Randall is a 34 y.o. female with no PMH who presented to [facility] hospital as a transfer from [facility] for gallstone pancreatitis.  Upon presentation lipase was over 7K.  LFTs were elevated including total bili at 4.1.  She was seen by surgery and improved clinically.  Yesterday lipase was down to 372 and patient was not having pain.  She went to the OR today for cholecystectomy with IOC.  Called by Dr. Fredricka Bonine, IOC positive.  Dr. Russella Dar reviewed and agrees, stone in distal CBD.  Lipase back up to just over 1K today.  LFT still elevated with total bili 5.8, AST 114, ALT 243, ALP 153.  CT scan of the abdomen and pelvis with contrast on 6/11 showed the following:  IMPRESSION: 1. Acute edematous pancreatitis. No acute peripancreatic collection or pancreatic necrosis. 2. Hepatic steatosis. Ill-defined central hypodense area measuring 2 cm may represent more focal fatty infiltration versus underlying lesion. 3. Mild common bile duct dilatation measuring 8 mm. No intrahepatic biliary ductal dilatation. Gallstones on ultrasound are not well-defined by CT. 4. Small fat containing umbilical hernia.  She was seen in her room after surgery.  Family present at bedside.  Has not tried any clear liquids yet.  Has not passed any flatus yet.  Saying that she has been itchy from the elevated bilirubin and has gotten some benadryl for that while she is here.  Past Medical History:  Diagnosis Date  . Class 3 severe obesity due to excess calories without serious comorbidity with body mass index (BMI) of 40.0 to 44.9 in adult [facility]) 06/22/2019    History reviewed. No pertinent surgical history.  Prior to Admission medications   Medication Sig Start Date End Date Taking? Authorizing Provider  HYDROcodone-acetaminophen (NORCO/VICODIN)  5-325 MG tablet Take 1 tablet by mouth every 6 (six) hours as needed for moderate pain or severe pain.  06/20/19 06/25/19 Yes [provider]  Multiple Vitamins-Minerals (MULTIVITAMIN ADULT) CHEW Chew 1 tablet by mouth daily.   Yes [provider]  ondansetron (ZOFRAN-ODT) 4 MG disintegrating tablet Take 4 mg by mouth every 8 (eight) hours as needed for nausea or vomiting.  06/20/19 06/27/19 Yes [provider]    Current Facility-Administered Medications  Medication Dose Route Frequency Provider Last Rate Last Admin  . 0.9 % irrigation (POUR BTL)    PRN Phylliss Blakes A, MD   1,000 mL at 06/24/19 0900  . acetaminophen (TYLENOL) 500 MG tablet           . [MAR Hold] acetaminophen (TYLENOL) tablet 500 mg  500 mg Oral Q6H PRN Opyd, Lavone Neri, MD   500 mg at 06/23/19 2002  . bupivacaine-EPINEPHrine (MARCAINE W/ EPI) 0.25% -1:200000 (with pres) injection    PRN Phylliss Blakes A, MD   45 mL at 06/24/19 0930  . [MAR Hold] diphenhydrAMINE (BENADRYL) injection 12.5 mg  12.5 mg Intravenous Q6H PRN Shalhoub, Deno Lunger, MD      . fentaNYL (SUBLIMAZE) injection 25-50 mcg  25-50 mcg Intravenous Q5 min PRN Cecile Hearing, MD      . [facility] Hold] heparin injection 5,000 Units  5,000 Units Subcutaneous Q8H Dimple Nanas, MD   5,000 Units at 06/23/19 2145  . [MAR Hold] HYDROmorphone (DILAUDID) injection 0.5 mg  0.5 mg Intravenous Q3H PRN Shalhoub, Deno Lunger, MD  Or  . [MAR Hold] HYDROmorphone (DILAUDID) injection 1 mg  1 mg Intravenous Q3H PRN Shalhoub, Sherryll Burger, MD   1 mg at 06/24/19 0433  . iohexol (OMNIPAQUE) 300 MG/ML solution    PRN Romana Juniper A, MD   10 mL at 06/24/19 0930  . lactated ringers infusion    Continuous PRN Romana Juniper A, MD   1,000 mL at 06/24/19 0859  . [MAR Hold] ondansetron (ZOFRAN) injection 4 mg  4 mg Intravenous Q6H PRN Shalhoub, Sherryll Burger, MD   4 mg at 06/23/19 1903  . [MAR Hold] pantoprazole (PROTONIX) injection 40 mg  40 mg Intravenous Q24H  Vernelle Emerald, MD   40 mg at 06/23/19 2145  . [MAR Hold] polyethylene glycol (MIRALAX / GLYCOLAX) packet 17 g  17 g Oral Daily PRN Amin, Ankit Chirag, MD      . promethazine (PHENERGAN) injection 6.25-12.5 mg  6.25-12.5 mg Intravenous Q15 min PRN Catalina Gravel, MD      . [facility] Hold] senna-docusate (Senokot-S) tablet 2 tablet  2 tablet Oral QHS PRN Damita Lack, MD        Allergies as of 06/22/2019  . (No Known Allergies)    Family History  Problem Relation Age of Onset  . Other Neg Hx     Social History   Socioeconomic History  . Marital status: Married    Spouse name: Not on file  . Number of children: Not on file  . Years of education: Not on file  . Highest education level: Not on file  Occupational History  . Not on file  Tobacco Use  . Smoking status: Never Smoker  . Smokeless tobacco: Never Used  Substance and Sexual Activity  . Alcohol use: Not Currently  . Drug use: Not Currently  . Sexual activity: Not on file  Other Topics Concern  . Not on file  Social History Narrative  . Not on file   Social Determinants of Health   Financial Resource Strain:   . Difficulty of Paying Living Expenses:   Food Insecurity:   . Worried About Charity fundraiser in the Last Year:   . Arboriculturist in the Last Year:   Transportation Needs:   . Film/video editor (Medical):   Marland Kitchen Lack of Transportation (Non-Medical):   Physical Activity:   . Days of Exercise per Week:   . Minutes of Exercise per Session:   Stress:   . Feeling of Stress :   Social Connections:   . Frequency of Communication with Friends and Family:   . Frequency of Social Gatherings with Friends and Family:   . Attends Religious Services:   . Active Member of Clubs or Organizations:   . Attends Archivist Meetings:   Marland Kitchen Marital Status:   Intimate Partner Violence:   . Fear of Current or Ex-Partner:   . Emotionally Abused:   Marland Kitchen Physically Abused:   . Sexually Abused:      Review of Systems: ROS is O/W negative except as mentioned in HPI.  Physical Exam: Vital signs in last 24 hours: Temp:  [97.7 F (36.5 C)-99.1 F (37.3 C)] 98.5 F (36.9 C) (06/13 0543) Pulse Rate:  [80-92] 87 (06/13 0543) Resp:  [15-18] 18 (06/13 0543) BP: (102-125)/(58-88) 125/72 (06/13 0543) SpO2:  [94 %-98 %] 98 % (06/13 0543) Last BM Date: 06/22/19 General:  Alert, Well-developed, well-nourished, pleasant and cooperative in NAD Head:  Normocephalic and atraumatic. Eyes:  Mild scleral  icterus noted. Ears:  Normal auditory acuity. Mouth:  No deformity or lesions.   Lungs:  Clear throughout to auscultation.  No wheezes, crackles, or rhonchi.  Heart:  Regular rate and rhythm; no murmurs, clicks, rubs, or gallops. Abdomen:  Soft, non-distended.  BS minimal/quiet.  Diffusely tender (appropriately). Msk:  Symmetrical without gross deformities. Pulses:  Normal pulses noted. Extremities:  Without clubbing or edema. Neurologic:  Alert and oriented x 4;  grossly normal neurologically. Skin:  Intact without significant lesions or rashes. Psych:  Alert and cooperative. Normal mood and affect.  Intake/Output from previous day: 06/12 0701 - 06/13 0700 In: 2360 [P.O.:360; I.V.:2000] Out: -  Intake/Output this shift: Total I/O In: 1100 [I.V.:1000; IV Piggyback:100] Out: 10 [Blood:10]  Lab Results: Recent Labs    06/22/19 0849 06/23/19 0549 06/24/19 0600  WBC 8.0 7.9 8.8  HGB 13.3 12.6 13.0  HCT 42.1 38.9 40.9  PLT 288 285 299   BMET Recent Labs    06/22/19 2204 06/23/19 0549 06/24/19 0600  NA 135 135 134*  K 3.9 3.8 3.7  CL 97* 101 99  CO2 25 23 22   GLUCOSE 129* 112* 110*  BUN 7 7 6   CREATININE 0.58 0.60 0.67  CALCIUM 9.2 9.1 9.3   LFT Recent Labs    06/22/19 2204 06/23/19 0549 06/24/19 0600  PROT 7.6   < > 7.4  ALBUMIN 3.9  3.9   < > 3.6  AST 148*   < > 114*  ALT 318*   < > 243*  ALKPHOS 151*   < > 153*  BILITOT 5.2*   < > 5.8*  BILIDIR 2.9*  --    --   IBILI 2.3*  --   --    < > = values in this interval not displayed.   Hepatitis Panel Recent Labs    06/22/19 2204  HEPBSAG NON REACTIVE  HCVAB NON REACTIVE  HEPAIGM NON REACTIVE  HEPBIGM NON REACTIVE   Studies/Results: CT ABDOMEN PELVIS W CONTRAST  Result Date: 06/22/2019 CLINICAL DATA:  Pancreatitis.  Epigastric pain. EXAM: CT ABDOMEN AND PELVIS WITH CONTRAST TECHNIQUE: Multidetector CT imaging of the abdomen and pelvis was performed using the standard protocol following bolus administration of intravenous contrast. CONTRAST:  2205 OMNIPAQUE IOHEXOL 300 MG/ML  SOLN COMPARISON:  Abdominal ultrasound yesterday. FINDINGS: Lower chest: Lung bases are clear.  No pleural fluid. Hepatobiliary: Diffusely decreased hepatic density consistent with steatosis. Ill-defined central hypodense area measuring 2 cm common series 2, image 18, may represent more focal fatty infiltration versus underlying lesion. Gallstones on ultrasound are not well-defined by CT. Common bile duct appears mildly dilated measuring 8 mm. No intrahepatic biliary ductal dilatation. Pancreas: Moderate peripancreatic fat stranding about the distal body and tail. No ductal dilatation. Homogeneous enhancement without necrosis. No acute peripancreatic collection. No evidence of underlying pancreatic mass. Spleen: Normal in size without focal abnormality. Splenule at the hilum. Adrenals/Urinary Tract: Normal adrenal glands. No hydronephrosis or perinephric edema. Homogeneous renal enhancement. Urinary bladder is physiologically distended without wall thickening. Stomach/Bowel: Stomach and duodenum are unremarkable. No small bowel obstruction or dilatation. There is slight fecalization of distal small bowel contents. Normal appendix. Small to moderate volume of stool throughout the colon. No colonic wall thickening. Vascular/Lymphatic: Patent portal and splenic veins. Patent mesenteric veins. Normal caliber abdominal aorta. Few prominent  central mesenteric nodes are likely reactive. Reproductive: Physiologic corpus luteal cyst on the right. No adnexal mass. Uterus is unremarkable. Other: Peripancreatic fat stranding with minimal adjacent fluid. No organized  fluid collection. No ascites elsewhere. No free air. Small fat containing umbilical hernia. Musculoskeletal: There are no acute or suspicious osseous abnormalities. Degenerative change of the pubic symphysis. IMPRESSION: 1. Acute edematous pancreatitis. No acute peripancreatic collection or pancreatic necrosis. 2. Hepatic steatosis. Ill-defined central hypodense area measuring 2 cm may represent more focal fatty infiltration versus underlying lesion. 3. Mild common bile duct dilatation measuring 8 mm. No intrahepatic biliary ductal dilatation. Gallstones on ultrasound are not well-defined by CT. 4. Small fat containing umbilical hernia. Electronically Signed   By: Narda Rutherford M.D.   On: 06/22/2019 22:55   IMPRESSION:  *Gallstone pancreatitis:  Initial presentation with lipase over 7K.  Improved significantly clinically and lipase down to 372 yesterday.  Had lap chole today and IOC was positive for distal CBD stone.  Lipase back up to just over 1K today.  Total bili 5.8 and other LFTs elevated as well.  PLAN: *ERCP on 6/14.  The risks, benefits, and alternatives to ERCP were discussed with the patient and she consents to proceed.  Peri-op dose of abx and indomethacin suppository ordered. *Trend LFTs. *Post-op management per surgery.  Princella Pellegrini. Zehr  06/24/2019, 9:55 AM    Attending Physician Note   I have taken a history, examined the patient and reviewed the chart. I agree with the Advanced Practitioner's note, impression and recommendations.  Impression: * Resolving biliary pancreatitis.  * Cholelithiasis, S/P lap chole today.  * Choledocholithiasis: persistently elevated LFTs, t bili now 5.8. IOC images reviewed: mild CBD dilation, distal CBD filling defect and limited  contrast passing into duodenum c/w CBD stone(s).  Recommendations: * Trend LFTs, lipase * Advance diet gradually as tolerated per surgical service * ERCP with Dr. Leone Payor tomorrow at 9:30 am    Claudette Head, MD [facility]

## 2019-06-24 NOTE — Anesthesia Procedure Notes (Signed)
Procedure Name: Intubation Performed by: Aalijah Lanphere J, CRNA Pre-anesthesia Checklist: Patient identified, Emergency Drugs available, Suction available, Patient being monitored and Timeout performed Patient Re-evaluated:Patient Re-evaluated prior to induction Oxygen Delivery Method: Circle system utilized Preoxygenation: Pre-oxygenation with 100% oxygen Induction Type: IV induction Ventilation: Mask ventilation without difficulty Laryngoscope Size: Mac and 4 Grade View: Grade I Tube type: Oral Tube size: 7.0 mm Number of attempts: 1 Airway Equipment and Method: Stylet Placement Confirmation: ETT inserted through vocal cords under direct vision,  positive ETCO2 and breath sounds checked- equal and bilateral Secured at: 21 cm Tube secured with: Tape Dental Injury: Teeth and Oropharynx as per pre-operative assessment        

## 2019-06-24 NOTE — H&P (View-Only) (Signed)
 Referring Provider:  Dr. Chelsea Connor Primary Care Physician:  Patient, No Pcp Per Primary Gastroenterologist:  Unassigned   Reason for Consultation:  CBD stone/positive IOC  HPI: Veronica Randall is a 34 y.o. female with no PMH who presented to [facility] as a transfer from [facility] for gallstone pancreatitis.  Upon presentation lipase was over 7K.  LFTs were elevated including total bili at 4.1.  She was seen by surgery and improved clinically.  Yesterday lipase was down to 372 and patient was not having pain.  She went to the OR today for cholecystectomy with IOC.  Called by Dr. Connor, IOC positive.  Dr. Lashaun Krapf reviewed and agrees, stone in distal CBD.  Lipase back up to just over 1K today.  LFT still elevated with total bili 5.8, AST 114, ALT 243, ALP 153.  CT scan of the abdomen and pelvis with contrast on 6/11 showed the following:  IMPRESSION: 1. Acute edematous pancreatitis. No acute peripancreatic collection or pancreatic necrosis. 2. Hepatic steatosis. Ill-defined central hypodense area measuring 2 cm may represent more focal fatty infiltration versus underlying lesion. 3. Mild common bile duct dilatation measuring 8 mm. No intrahepatic biliary ductal dilatation. Gallstones on ultrasound are not well-defined by CT. 4. Small fat containing umbilical hernia.  She was seen in her room after surgery.  Family present at bedside.  Has not tried any clear liquids yet.  Has not passed any flatus yet.  Saying that she has been itchy from the elevated bilirubin and has gotten some benadryl for that while she is here.  Past Medical History:  Diagnosis Date  . Class 3 severe obesity due to excess calories without serious comorbidity with body mass index (BMI) of 40.0 to 44.9 in adult (HCC) 06/22/2019    History reviewed. No pertinent surgical history.  Prior to Admission medications   Medication Sig Start Date End Date Taking? Authorizing Provider  HYDROcodone-acetaminophen (NORCO/VICODIN)  5-325 MG tablet Take 1 tablet by mouth every 6 (six) hours as needed for moderate pain or severe pain.  06/20/19 06/25/19 Yes [provider]  Multiple Vitamins-Minerals (MULTIVITAMIN ADULT) CHEW Chew 1 tablet by mouth daily.   Yes [provider]  ondansetron (ZOFRAN-ODT) 4 MG disintegrating tablet Take 4 mg by mouth every 8 (eight) hours as needed for nausea or vomiting.  06/20/19 06/27/19 Yes [provider]    Current Facility-Administered Medications  Medication Dose Route Frequency Provider Last Rate Last Admin  . 0.9 % irrigation (POUR BTL)    PRN Connor, Chelsea A, MD   1,000 mL at 06/24/19 0900  . acetaminophen (TYLENOL) 500 MG tablet           . [MAR Hold] acetaminophen (TYLENOL) tablet 500 mg  500 mg Oral Q6H PRN Opyd, Timothy S, MD   500 mg at 06/23/19 2002  . bupivacaine-EPINEPHrine (MARCAINE W/ EPI) 0.25% -1:200000 (with pres) injection    PRN Connor, Chelsea A, MD   45 mL at 06/24/19 0930  . [MAR Hold] diphenhydrAMINE (BENADRYL) injection 12.5 mg  12.5 mg Intravenous Q6H PRN Shalhoub, George J, MD      . fentaNYL (SUBLIMAZE) injection 25-50 mcg  25-50 mcg Intravenous Q5 min PRN Turk, Stephen Edward, MD      . [MAR Hold] heparin injection 5,000 Units  5,000 Units Subcutaneous Q8H Amin, Ankit Chirag, MD   5,000 Units at 06/23/19 2145  . [MAR Hold] HYDROmorphone (DILAUDID) injection 0.5 mg  0.5 mg Intravenous Q3H PRN Shalhoub, George J, MD         Or  . [MAR Hold] HYDROmorphone (DILAUDID) injection 1 mg  1 mg Intravenous Q3H PRN Shalhoub, Sherryll Burger, MD   1 mg at 06/24/19 0433  . iohexol (OMNIPAQUE) 300 MG/ML solution    PRN Romana Juniper A, MD   10 mL at 06/24/19 0930  . lactated ringers infusion    Continuous PRN Romana Juniper A, MD   1,000 mL at 06/24/19 0859  . [MAR Hold] ondansetron (ZOFRAN) injection 4 mg  4 mg Intravenous Q6H PRN Shalhoub, Sherryll Burger, MD   4 mg at 06/23/19 1903  . [MAR Hold] pantoprazole (PROTONIX) injection 40 mg  40 mg Intravenous Q24H  Vernelle Emerald, MD   40 mg at 06/23/19 2145  . [MAR Hold] polyethylene glycol (MIRALAX / GLYCOLAX) packet 17 g  17 g Oral Daily PRN Amin, Ankit Chirag, MD      . promethazine (PHENERGAN) injection 6.25-12.5 mg  6.25-12.5 mg Intravenous Q15 min PRN Catalina Gravel, MD      . Doug Sou Hold] senna-docusate (Senokot-S) tablet 2 tablet  2 tablet Oral QHS PRN Damita Lack, MD        Allergies as of 06/22/2019  . (No Known Allergies)    Family History  Problem Relation Age of Onset  . Other Neg Hx     Social History   Socioeconomic History  . Marital status: Married    Spouse name: Not on file  . Number of children: Not on file  . Years of education: Not on file  . Highest education level: Not on file  Occupational History  . Not on file  Tobacco Use  . Smoking status: Never Smoker  . Smokeless tobacco: Never Used  Substance and Sexual Activity  . Alcohol use: Not Currently  . Drug use: Not Currently  . Sexual activity: Not on file  Other Topics Concern  . Not on file  Social History Narrative  . Not on file   Social Determinants of Health   Financial Resource Strain:   . Difficulty of Paying Living Expenses:   Food Insecurity:   . Worried About Charity fundraiser in the Last Year:   . Arboriculturist in the Last Year:   Transportation Needs:   . Film/video editor (Medical):   Marland Kitchen Lack of Transportation (Non-Medical):   Physical Activity:   . Days of Exercise per Week:   . Minutes of Exercise per Session:   Stress:   . Feeling of Stress :   Social Connections:   . Frequency of Communication with Friends and Family:   . Frequency of Social Gatherings with Friends and Family:   . Attends Religious Services:   . Active Member of Clubs or Organizations:   . Attends Archivist Meetings:   Marland Kitchen Marital Status:   Intimate Partner Violence:   . Fear of Current or Ex-Partner:   . Emotionally Abused:   Marland Kitchen Physically Abused:   . Sexually Abused:      Review of Systems: ROS is O/W negative except as mentioned in HPI.  Physical Exam: Vital signs in last 24 hours: Temp:  [97.7 F (36.5 C)-99.1 F (37.3 C)] 98.5 F (36.9 C) (06/13 0543) Pulse Rate:  [80-92] 87 (06/13 0543) Resp:  [15-18] 18 (06/13 0543) BP: (102-125)/(58-88) 125/72 (06/13 0543) SpO2:  [94 %-98 %] 98 % (06/13 0543) Last BM Date: 06/22/19 General:  Alert, Well-developed, well-nourished, pleasant and cooperative in NAD Head:  Normocephalic and atraumatic. Eyes:  Mild scleral  icterus noted. Ears:  Normal auditory acuity. Mouth:  No deformity or lesions.   Lungs:  Clear throughout to auscultation.  No wheezes, crackles, or rhonchi.  Heart:  Regular rate and rhythm; no murmurs, clicks, rubs, or gallops. Abdomen:  Soft, non-distended.  BS minimal/quiet.  Diffusely tender (appropriately). Msk:  Symmetrical without gross deformities. Pulses:  Normal pulses noted. Extremities:  Without clubbing or edema. Neurologic:  Alert and oriented x 4;  grossly normal neurologically. Skin:  Intact without significant lesions or rashes. Psych:  Alert and cooperative. Normal mood and affect.  Intake/Output from previous day: 06/12 0701 - 06/13 0700 In: 2360 [P.O.:360; I.V.:2000] Out: -  Intake/Output this shift: Total I/O In: 1100 [I.V.:1000; IV Piggyback:100] Out: 10 [Blood:10]  Lab Results: Recent Labs    06/22/19 0849 06/23/19 0549 06/24/19 0600  WBC 8.0 7.9 8.8  HGB 13.3 12.6 13.0  HCT 42.1 38.9 40.9  PLT 288 285 299   BMET Recent Labs    06/22/19 2204 06/23/19 0549 06/24/19 0600  NA 135 135 134*  K 3.9 3.8 3.7  CL 97* 101 99  CO2 25 23 22   GLUCOSE 129* 112* 110*  BUN 7 7 6   CREATININE 0.58 0.60 0.67  CALCIUM 9.2 9.1 9.3   LFT Recent Labs    06/22/19 2204 06/23/19 0549 06/24/19 0600  PROT 7.6   < > 7.4  ALBUMIN 3.9  3.9   < > 3.6  AST 148*   < > 114*  ALT 318*   < > 243*  ALKPHOS 151*   < > 153*  BILITOT 5.2*   < > 5.8*  BILIDIR 2.9*  --    --   IBILI 2.3*  --   --    < > = values in this interval not displayed.   Hepatitis Panel Recent Labs    06/22/19 2204  HEPBSAG NON REACTIVE  HCVAB NON REACTIVE  HEPAIGM NON REACTIVE  HEPBIGM NON REACTIVE   Studies/Results: CT ABDOMEN PELVIS W CONTRAST  Result Date: 06/22/2019 CLINICAL DATA:  Pancreatitis.  Epigastric pain. EXAM: CT ABDOMEN AND PELVIS WITH CONTRAST TECHNIQUE: Multidetector CT imaging of the abdomen and pelvis was performed using the standard protocol following bolus administration of intravenous contrast. CONTRAST:  2205 OMNIPAQUE IOHEXOL 300 MG/ML  SOLN COMPARISON:  Abdominal ultrasound yesterday. FINDINGS: Lower chest: Lung bases are clear.  No pleural fluid. Hepatobiliary: Diffusely decreased hepatic density consistent with steatosis. Ill-defined central hypodense area measuring 2 cm common series 2, image 18, may represent more focal fatty infiltration versus underlying lesion. Gallstones on ultrasound are not well-defined by CT. Common bile duct appears mildly dilated measuring 8 mm. No intrahepatic biliary ductal dilatation. Pancreas: Moderate peripancreatic fat stranding about the distal body and tail. No ductal dilatation. Homogeneous enhancement without necrosis. No acute peripancreatic collection. No evidence of underlying pancreatic mass. Spleen: Normal in size without focal abnormality. Splenule at the hilum. Adrenals/Urinary Tract: Normal adrenal glands. No hydronephrosis or perinephric edema. Homogeneous renal enhancement. Urinary bladder is physiologically distended without wall thickening. Stomach/Bowel: Stomach and duodenum are unremarkable. No small bowel obstruction or dilatation. There is slight fecalization of distal small bowel contents. Normal appendix. Small to moderate volume of stool throughout the colon. No colonic wall thickening. Vascular/Lymphatic: Patent portal and splenic veins. Patent mesenteric veins. Normal caliber abdominal aorta. Few prominent  central mesenteric nodes are likely reactive. Reproductive: Physiologic corpus luteal cyst on the right. No adnexal mass. Uterus is unremarkable. Other: Peripancreatic fat stranding with minimal adjacent fluid. No organized  fluid collection. No ascites elsewhere. No free air. Small fat containing umbilical hernia. Musculoskeletal: There are no acute or suspicious osseous abnormalities. Degenerative change of the pubic symphysis. IMPRESSION: 1. Acute edematous pancreatitis. No acute peripancreatic collection or pancreatic necrosis. 2. Hepatic steatosis. Ill-defined central hypodense area measuring 2 cm may represent more focal fatty infiltration versus underlying lesion. 3. Mild common bile duct dilatation measuring 8 mm. No intrahepatic biliary ductal dilatation. Gallstones on ultrasound are not well-defined by CT. 4. Small fat containing umbilical hernia. Electronically Signed   By: Narda Rutherford M.D.   On: 06/22/2019 22:55   IMPRESSION:  *Gallstone pancreatitis:  Initial presentation with lipase over 7K.  Improved significantly clinically and lipase down to 372 yesterday.  Had lap chole today and IOC was positive for distal CBD stone.  Lipase back up to just over 1K today.  Total bili 5.8 and other LFTs elevated as well.  PLAN: *ERCP on 6/14.  The risks, benefits, and alternatives to ERCP were discussed with the patient and she consents to proceed.  Peri-op dose of abx and indomethacin suppository ordered. *Trend LFTs. *Post-op management per surgery.  Princella Pellegrini. Zehr  06/24/2019, 9:55 AM    Attending Physician Note   I have taken a history, examined the patient and reviewed the chart. I agree with the Advanced Practitioner's note, impression and recommendations.  Impression: * Resolving biliary pancreatitis.  * Cholelithiasis, S/P lap chole today.  * Choledocholithiasis: persistently elevated LFTs, t bili now 5.8. IOC images reviewed: mild CBD dilation, distal CBD filling defect and limited  contrast passing into duodenum c/w CBD stone(s).  Recommendations: * Trend LFTs, lipase * Advance diet gradually as tolerated per surgical service * ERCP with Dr. Leone Payor tomorrow at 9:30 am    Claudette Head, MD [facility]

## 2019-06-25 ENCOUNTER — Inpatient Hospital Stay (HOSPITAL_COMMUNITY): Payer: 59 | Admitting: Certified Registered"

## 2019-06-25 ENCOUNTER — Encounter (HOSPITAL_COMMUNITY): Payer: Self-pay | Admitting: Surgery

## 2019-06-25 ENCOUNTER — Encounter (HOSPITAL_COMMUNITY)
Admission: AD | Disposition: A | Discharge status: Home / Self Care | Payer: Self-pay | Source: Home / Self Care | Attending: Internal Medicine

## 2019-06-25 ENCOUNTER — Inpatient Hospital Stay (HOSPITAL_COMMUNITY): Payer: 59

## 2019-06-25 DIAGNOSIS — R932 Abnormal findings on diagnostic imaging of liver and biliary tract: Secondary | ICD-10-CM

## 2019-06-25 DIAGNOSIS — K838 Other specified diseases of biliary tract: Secondary | ICD-10-CM

## 2019-06-25 HISTORY — PX: ERCP: SHX5425

## 2019-06-25 HISTORY — PX: SPHINCTEROTOMY: SHX5544

## 2019-06-25 LAB — GLUCOSE, CAPILLARY
Glucose-Capillary: 148 mg/dL — ABNORMAL HIGH (ref 70–99)
Glucose-Capillary: 137 mg/dL — ABNORMAL HIGH (ref 70–99)

## 2019-06-25 LAB — CBC
HCT: 37.7 % (ref 36.0–46.0)
Hemoglobin: 12 g/dL (ref 12.0–15.0)
MCH: 28.6 pg (ref 26.0–34.0)
MCHC: 31.8 g/dL (ref 30.0–36.0)
MCV: 90 fL (ref 80.0–100.0)
Platelets: 270 10*3/uL (ref 150–400)
RBC: 4.19 MIL/uL (ref 3.87–5.11)
RDW: 14.1 % (ref 11.5–15.5)
WBC: 8.4 10*3/uL (ref 4.0–10.5)
nRBC: 0 % (ref 0.0–0.2)

## 2019-06-25 LAB — COMPREHENSIVE METABOLIC PANEL
ALT: 211 U/L — ABNORMAL HIGH (ref 0–44)
AST: 103 U/L — ABNORMAL HIGH (ref 15–41)
Albumin: 3.2 g/dL — ABNORMAL LOW (ref 3.5–5.0)
Alkaline Phosphatase: 133 U/L — ABNORMAL HIGH (ref 38–126)
Anion gap: 14 (ref 5–15)
BUN: 9 mg/dL (ref 6–20)
CO2: 26 mmol/L (ref 22–32)
Calcium: 8.9 mg/dL (ref 8.9–10.3)
Chloride: 100 mmol/L (ref 98–111)
Creatinine, Ser: 0.8 mg/dL (ref 0.44–1.00)
GFR calc Af Amer: >60 mL/min (ref >60)
GFR calc non Af Amer: >60 mL/min (ref >60)
Glucose, Bld: 111 mg/dL — ABNORMAL HIGH (ref 70–99)
Potassium: 3.9 mmol/L (ref 3.5–5.1)
Sodium: 140 mmol/L (ref 135–145)
Total Bilirubin: 2.3 mg/dL — ABNORMAL HIGH (ref 0.3–1.2)
Total Protein: 7.2 g/dL (ref 6.5–8.1)

## 2019-06-25 LAB — MAGNESIUM: Magnesium: 2 mg/dL (ref 1.7–2.4)

## 2019-06-25 SURGERY — ERCP, WITH INTERVENTION IF INDICATED
Anesthesia: General

## 2019-06-25 MED ORDER — MIDAZOLAM HCL 2 MG/2ML IJ SOLN
INTRAMUSCULAR | Status: DC | PRN
  Administered 2019-06-25: 2 mg via INTRAVENOUS

## 2019-06-25 MED ORDER — SODIUM CHLORIDE 0.9 % IV SOLN: INTRAVENOUS | Status: DC

## 2019-06-25 MED ORDER — FENTANYL CITRATE (PF) 250 MCG/5ML IJ SOLN
INTRAMUSCULAR | Status: AC
  Filled 2019-06-25: qty 5

## 2019-06-25 MED ORDER — PANTOPRAZOLE SODIUM 40 MG PO TBEC
40.0000 mg | DELAYED_RELEASE_TABLET | Freq: Every day | ORAL | Status: DC
  Administered 2019-06-25: 40 mg via ORAL
  Filled 2019-06-25: qty 1

## 2019-06-25 MED ORDER — INDOMETHACIN 50 MG RE SUPP
RECTAL | Status: AC
  Filled 2019-06-25: qty 2

## 2019-06-25 MED ORDER — PHENYLEPHRINE 40 MCG/ML (10ML) SYRINGE FOR IV PUSH (FOR BLOOD PRESSURE SUPPORT)
PREFILLED_SYRINGE | INTRAVENOUS | Status: DC | PRN
  Administered 2019-06-25: 120 ug via INTRAVENOUS

## 2019-06-25 MED ORDER — LIDOCAINE 2% (20 MG/ML) 5 ML SYRINGE
INTRAMUSCULAR | Status: DC | PRN
  Administered 2019-06-25: 100 mg via INTRAVENOUS

## 2019-06-25 MED ORDER — DEXAMETHASONE SODIUM PHOSPHATE 10 MG/ML IJ SOLN
INTRAMUSCULAR | Status: DC | PRN
Start: 2019-06-25 — End: 2019-06-25
  Administered 2019-06-25: 10 mg via INTRAVENOUS

## 2019-06-25 MED ORDER — SODIUM CHLORIDE 0.9 % IV SOLN
INTRAVENOUS | Status: DC | PRN
  Administered 2019-06-25: 37 mL

## 2019-06-25 MED ORDER — SODIUM CHLORIDE 0.9 % IV SOLN
1.5000 g | Freq: Once | INTRAVENOUS | Status: AC
  Administered 2019-06-25: 1.5 g via INTRAVENOUS
  Filled 2019-06-25: qty 4
  Filled 2019-06-25: qty 1.5

## 2019-06-25 MED ORDER — ONDANSETRON HCL 4 MG/2ML IJ SOLN
INTRAMUSCULAR | Status: DC | PRN
  Administered 2019-06-25: 4 mg via INTRAVENOUS

## 2019-06-25 MED ORDER — PROPOFOL 10 MG/ML IV BOLUS
INTRAVENOUS | Status: DC | PRN
  Administered 2019-06-25: 150 mg via INTRAVENOUS

## 2019-06-25 MED ORDER — GLUCAGON HCL RDNA (DIAGNOSTIC) 1 MG IJ SOLR
INTRAMUSCULAR | Status: AC
  Filled 2019-06-25: qty 1

## 2019-06-25 MED ORDER — INDOMETHACIN 50 MG RE SUPP: 100.0000 mg | Freq: Once | RECTAL | Status: DC

## 2019-06-25 MED ORDER — SUCCINYLCHOLINE CHLORIDE 200 MG/10ML IV SOSY
PREFILLED_SYRINGE | INTRAVENOUS | Status: DC | PRN
  Administered 2019-06-25: 160 mg via INTRAVENOUS

## 2019-06-25 MED ORDER — FENTANYL CITRATE (PF) 250 MCG/5ML IJ SOLN
INTRAMUSCULAR | Status: DC | PRN
  Administered 2019-06-25: 50 ug via INTRAVENOUS
  Administered 2019-06-25: 100 ug via INTRAVENOUS

## 2019-06-25 MED ORDER — INDOMETHACIN 50 MG RE SUPP
RECTAL | Status: DC | PRN
  Administered 2019-06-25: 100 mg via RECTAL

## 2019-06-25 MED ORDER — HYDROXYZINE HCL 25 MG PO TABS: 25.0000 mg | ORAL_TABLET | Freq: Three times a day (TID) | ORAL | Status: DC | PRN

## 2019-06-25 MED ORDER — LACTATED RINGERS IV SOLN: INTRAVENOUS | Status: DC | PRN

## 2019-06-25 MED ORDER — MIDAZOLAM HCL 2 MG/2ML IJ SOLN
INTRAMUSCULAR | Status: AC
  Filled 2019-06-25: qty 2

## 2019-06-25 NOTE — Progress Notes (Signed)
PROGRESS NOTE    Veronica Randall  ZOX:[id number] DOB: 06/16/1985 DOA: 06/22/2019 PCP: Patient, No Pcp Per   Brief Narrative:  34 year old with no significant medical history presents to the hospital with complaints of abdominal pain found to have gallstone pancreatitis.  Had transaminitis with elevated total bilirubin.  General surgery consulted. S/p Lap Chole 6/13 and ERCP pm 6/14. Tolerated well.    Assessment & Plan:   Principal Problem:   Acute gallstone pancreatitis Active Problems:   Dehydration with hyponatremia   Hyperglycemia   Class 3 severe obesity due to excess calories without serious comorbidity with body mass index (BMI) of 40.0 to 44.9 in adult [facility])   GERD without esophagitis   Hypokalemia due to excessive gastrointestinal loss of potassium   Abnormal cholangiogram  Acute gallstone pancreatitis Mild to moderate dehydration with hyponatremia- resolved.  -s/p Lap chole 6/13, ERCP on 6/14. Clear liquid diet today.   Hyperglycemia -Hemoglobin A1c-5.8; accuchecks  Obesity with BMI greater than 40 -Outpatient follow-up with PCP, recommend weight loss diet and exercise  GERD without esophagitis -PPI    DVT prophylaxis: Subcu heparin Code Status: Full code Family Communication: Brother at bedside.   Status is: Inpatient  Remains inpatient appropriate because:IV treatments appropriate due to intensity of illness or inability to take PO   Dispo: The patient is from: Home              Anticipated d/c is to: Home              Anticipated d/c date is: 1 day              Patient currently is not medically stable to d/c.  S/p ercp today, clear liquid diet today. Home tomorrow.   Subjective: Tolerated procedure well. No complaints.   Review of Systems Otherwise negative except as per HPI, including: General = no fevers, chills, dizziness,  fatigue HEENT/EYES = negative for loss of vision, double vision, blurred vision,  sore throa Cardiovascular= negative for  chest pain, palpitation Respiratory/lungs= negative for shortness of breath, cough, wheezing; hemoptysis,  Gastrointestinal= negative for nausea, vomiting, abdominal pain Genitourinary= negative for Dysuria MSK = Negative for arthralgia, myalgias Neurology= Negative for headache, numbness, tingling  Psychiatry= Negative for suicidal and homocidal ideation Skin= Negative for Rash  Examination: Constitutional: Not in acute distress Respiratory: Clear to auscultation bilaterally Cardiovascular: Normal sinus rhythm, no rubs Abdomen: Nontender nondistended good bowel sounds Musculoskeletal: No edema noted Skin: No rashes seen Neurologic: CN 2-12 grossly intact.  And nonfocal Psychiatric: Normal judgment and insight. Alert and oriented x 3. Normal mood.     Objective: Vitals:   06/25/19 0910 06/25/19 1057 06/25/19 1100 06/25/19 1110  BP: (!) 123/57 123/67 131/75 120/60  Pulse: 64 78 65 63  Resp: (!) 25 18 19  (!) 21  Temp: 99 F (37.2 C) 98.9 F (37.2 C)    TempSrc: Oral Oral    SpO2: 96% 95% 94% 94%  Weight:      Height:        Intake/Output Summary (Last 24 hours) at 06/25/2019 1336 Last data filed at 06/25/2019 1056 Gross per 24 hour  Intake 1440 ml  Output 0 ml  Net 1440 ml   Filed Weights   06/22/19 0840 06/22/19 2028  Weight: 104 kg 103.5 kg     Data Reviewed:   CBC: Recent Labs  Lab 06/22/19 0849 06/23/19 0549 06/24/19 0600 06/25/19 0540  WBC 8.0 7.9 8.8 8.4  NEUTROABS 4.9 5.8  --   --  HGB 13.3 12.6 13.0 12.0  HCT 42.1 38.9 40.9 37.7  MCV 87.5 87.0 88.5 90.0  PLT 288 285 299 270   Basic Metabolic Panel: Recent Labs  Lab 06/22/19 2204 06/23/19 0549 06/24/19 0600 06/24/19 1202 06/25/19 0540  NA 135 135 134* 135 140  K 3.9 3.8 3.7 3.7 3.9  CL 97* 101 99 99 100  CO2 25 23 22 23 26   GLUCOSE 129* 112* 110* 129* 111*  BUN 7 7 6 6 9   CREATININE 0.58 0.60 0.67 0.61 0.80  CALCIUM 9.2 9.1 9.3 9.2 8.9  MG 1.9 1.9 1.8  --  2.0  PHOS 3.0  --   --    --   --    GFR: Estimated Creatinine Clearance: 116 mL/min (by C-G formula based on SCr of 0.8 mg/dL). Liver Function Tests: Recent Labs  Lab 06/22/19 2204 06/23/19 0549 06/24/19 0600 06/24/19 1202 06/25/19 0540  AST 148* 122* 114* 197* 103*  ALT 318* 274* 243* 279* 211*  ALKPHOS 151* 130* 153* 158* 133*  BILITOT 5.2* 4.4* 5.8* 5.6* 2.3*  PROT 7.6 6.8 7.4 7.4 7.2  ALBUMIN 3.9  3.9 3.4* 3.6 3.5 3.2*   Recent Labs  Lab 06/22/19 0849 06/23/19 0549 06/24/19 0600  LIPASE 7,228* 372* 1,184*   No results for input(s): AMMONIA in the last 168 hours. Coagulation Profile: No results for input(s): INR, PROTIME in the last 168 hours. Cardiac Enzymes: No results for input(s): CKTOTAL, CKMB, CKMBINDEX, TROPONINI in the last 168 hours. BNP (last 3 results) No results for input(s): PROBNP in the last 8760 hours. HbA1C: Recent Labs    06/23/19 0549  HGBA1C 5.8*   CBG: Recent Labs  Lab 06/23/19 1200 06/24/19 0544 06/25/19 1209  GLUCAP 115* 106* 148*   Lipid Profile: No results for input(s): CHOL, HDL, LDLCALC, TRIG, CHOLHDL, LDLDIRECT in the last 72 hours. Thyroid Function Tests: No results for input(s): TSH, T4TOTAL, FREET4, T3FREE, THYROIDAB in the last 72 hours. Anemia Panel: No results for input(s): VITAMINB12, FOLATE, FERRITIN, TIBC, IRON, RETICCTPCT in the last 72 hours. Sepsis Labs: Recent Labs  Lab 06/22/19 2204  LATICACIDVEN 1.4    Recent Results (from the past 240 hour(s))  SARS Coronavirus 2 by RT PCR (hospital order, performed in [facility] hospital lab) Nasopharyngeal Nasopharyngeal Swab     Status: None   Collection Time: 06/22/19 11:13 AM   Specimen: Nasopharyngeal Swab  Result Value Ref Range Status   SARS Coronavirus 2 NEGATIVE NEGATIVE Final    Comment: (NOTE) SARS-CoV-2 target nucleic acids are NOT DETECTED.  The SARS-CoV-2 RNA is generally detectable in upper and lower respiratory specimens during the acute phase of infection. The  lowest concentration of SARS-CoV-2 viral copies this assay can detect is 250 copies / mL. A negative result does not preclude SARS-CoV-2 infection and should not be used as the sole basis for treatment or other patient management decisions.  A negative result may occur with improper specimen collection / handling, submission of specimen other than nasopharyngeal swab, presence of viral mutation(s) within the areas targeted by this assay, and inadequate number of viral copies (<250 copies / mL). A negative result must be combined with clinical observations, patient history, and epidemiological information.  Fact Sheet for Patients:   BoilerBrush.com.cy  Fact Sheet for Healthcare Providers: https://pope.com/  This test is not yet approved or  cleared by the [place] FDA and has been authorized for detection and/or diagnosis of SARS-CoV-2 by FDA under an Emergency Use Authorization (EUA).  This EUA will remain in effect (meaning this test can be used) for the duration of the COVID-19 declaration under Section 564(b)(1) of the Act, 21 U.S.C. section 360bbb-3(b)(1), unless the authorization is terminated or revoked sooner.  Performed at [facility], [address] [place], [place] [address]          Radiology Studies: DG Cholangiogram Operative  Result Date: 06/24/2019 CLINICAL DATA:  Cholelithiasis, intraoperative cholangiogram EXAM: INTRAOPERATIVE CHOLANGIOGRAM TECHNIQUE: Cholangiographic images from the C-arm fluoroscopic device were submitted for interpretation post-operatively. Please see the procedural report for the amount of contrast and the fluoroscopy time utilized. COMPARISON:  06/21/2019, 06/22/2019 FINDINGS: Intraoperative cholangiogram performed during the laparoscopic procedure. The residual cystic duct, biliary confluence, common hepatic duct, and common bile duct are patent. However, distal CBD small  round filling defect persist on several images. Only a small amount of contrast passes through the distal CBD into the duodenum. Difficult to exclude distal choledocholithiasis. IMPRESSION: Possible distal CBD choledocholithiasis. Electronically Signed   By: Judie Petit.  Shick M.D.   On: 06/24/2019 11:36   DG ERCP BILIARY & PANCREATIC DUCTS  Result Date: 06/25/2019 CLINICAL DATA:  Acute gallstone pancreatitis. EXAM: ERCP TECHNIQUE: Multiple spot images obtained with the fluoroscopic device and submitted for interpretation post-procedure. COMPARISON:  CT abdomen and pelvis-06/22/2019; cholangio graphic images during intraoperative cholangiogram-06/24/2019 FLUOROSCOPY TIME:  1 minutes, 42 seconds FINDINGS: 13 spot intraoperative fluoroscopic images of the right upper abdominal quadrant during ERCP are provided for review Initial image demonstrates an ERCP probe overlying the right upper abdominal quadrant. Cholecystectomy clips overlies expected location of the gallbladder fossa Subsequent images demonstrate selective cannulation and opacification of the common bile duct which appears moderately dilated. There are no definitive persistent filling defect in the pacified portion of the biliary tree, though note, evaluation the distal most aspect of the CBD is degraded secondary to overlying radiopaque surgical support material. Subsequent images demonstrate insufflation of a balloon within the central aspect of the biliary tree with subsequent biliary sweeping and presumed sphincterotomy. IMPRESSION: ERCP with biliary sweeping and presumed sphincterotomy as above. These images were submitted for radiologic interpretation only. Please see the procedural report for the amount of contrast and the fluoroscopy time utilized. Electronically Signed   By: Simonne Come M.D.   On: 06/25/2019 11:51        Scheduled Meds: . heparin injection (subcutaneous)  5,000 Units Subcutaneous Q8H  . indomethacin  100 mg Rectal Once  .  [START ON 06/26/2019] pantoprazole  40 mg Oral QAC breakfast   Continuous Infusions:    LOS: 3 days   Time spent= 35 mins    Charity Tessier Joline Maxcy, MD [facility]  If 7PM-7AM, please contact night-coverage  06/25/2019, 1:36 PM

## 2019-06-25 NOTE — Progress Notes (Signed)
Patient ID: Veronica Randall, female   DOB: 1985-08-15, 34 y.o.   MRN: [id number]    1 Day Post-Op  Subjective: Appropriate soreness post op.  Tolerating liquids.  Prepared for ERCP today.  No nausea.  ROS: See above, otherwise other systems negative  Objective: Vital signs in last 24 hours: Temp:  [97.7 F (36.5 C)-98.8 F (37.1 C)] 97.8 F (36.6 C) (06/14 0547) Pulse Rate:  [63-97] 63 (06/14 0547) Resp:  [14-25] 14 (06/14 0547) BP: (91-126)/(60-78) 91/60 (06/14 0547) SpO2:  [92 %-100 %] 96 % (06/14 0547) Last BM Date:  (pre surgery)  Intake/Output from previous day: 06/13 0701 - 06/14 0700 In: 2240 [P.O.:840; I.V.:1300; IV Piggyback:100] Out: 610 [Urine:600; Blood:10] Intake/Output this shift: No intake/output data recorded.  PE: Abd: soft, obese, +BS, ND, incisions c/d/i, appropriately tender  Lab Results:  Recent Labs    06/24/19 0600 06/25/19 0540  WBC 8.8 8.4  HGB 13.0 12.0  HCT 40.9 37.7  PLT 299 270   BMET Recent Labs    06/24/19 1202 06/25/19 0540  NA 135 140  K 3.7 3.9  CL 99 100  CO2 23 26  GLUCOSE 129* 111*  BUN 6 9  CREATININE 0.61 0.80  CALCIUM 9.2 8.9   PT/INR No results for input(s): LABPROT, INR in the last 72 hours. CMP     Component Value Date/Time   NA 140 06/25/2019 0540   K 3.9 06/25/2019 0540   CL 100 06/25/2019 0540   CO2 26 06/25/2019 0540   GLUCOSE 111 (H) 06/25/2019 0540   BUN 9 06/25/2019 0540   CREATININE 0.80 06/25/2019 0540   CALCIUM 8.9 06/25/2019 0540   PROT 7.2 06/25/2019 0540   ALBUMIN 3.2 (L) 06/25/2019 0540   AST 103 (H) 06/25/2019 0540   ALT 211 (H) 06/25/2019 0540   ALKPHOS 133 (H) 06/25/2019 0540   BILITOT 2.3 (H) 06/25/2019 0540   GFRNONAA >60 06/25/2019 0540   GFRAA >60 06/25/2019 0540   Lipase     Component Value Date/Time   LIPASE 1,184 (H) 06/24/2019 0600       Studies/Results: DG Cholangiogram Operative  Result Date: 06/24/2019 CLINICAL DATA:  Cholelithiasis, intraoperative cholangiogram  EXAM: INTRAOPERATIVE CHOLANGIOGRAM TECHNIQUE: Cholangiographic images from the C-arm fluoroscopic device were submitted for interpretation post-operatively. Please see the procedural report for the amount of contrast and the fluoroscopy time utilized. COMPARISON:  06/21/2019, 06/22/2019 FINDINGS: Intraoperative cholangiogram performed during the laparoscopic procedure. The residual cystic duct, biliary confluence, common hepatic duct, and common bile duct are patent. However, distal CBD small round filling defect persist on several images. Only a small amount of contrast passes through the distal CBD into the duodenum. Difficult to exclude distal choledocholithiasis. IMPRESSION: Possible distal CBD choledocholithiasis. Electronically Signed   By: Judie Petit.  Shick M.D.   On: 06/24/2019 11:36    Anti-infectives: Anti-infectives (From admission, onward)   Start     Dose/Rate Route Frequency Ordered Stop   06/24/19 0802  ceFAZolin (ANCEF) 2-4 GM/100ML-% IVPB       Note to Pharmacy: Mirian Mo   : cabinet override      06/24/19 0802 06/24/19 0833   06/24/19 0600  ceFAZolin (ANCEF) IVPB 2g/100 mL premix        2 g 200 mL/hr over 30 Minutes Intravenous On call to O.R. 06/23/19 0734 06/24/19 0858       Assessment/Plan POD 1, s/p lap chole with +IOC, gallstone pancreatitis, Dr. Fredricka Bonine 6/13 -adv diet as tolerates after ERCP -ERCP today per GI -  LFTs trending down today -surgically stable at this time.  FEN - NPO for ERCP VTE - heparin ID - ancef, preop only   LOS: 3 days    Henreitta Cea , [facility] Surgery 06/25/2019, 7:48 AM Please see Amion for pager number during day hours 7:00am-4:30pm or 7:00am -11:30am on weekends

## 2019-06-25 NOTE — Op Note (Signed)
[facility] Patient Name: Veronica Randall Procedure Date: 06/25/2019 MRN: [id number] Attending MD: Gatha Mayer , MD Date of Birth: 10/12/1985 CSN: [id number] Age: 34 Admit Type: Inpatient Procedure:                ERCP Indications:              Filling defect on intraoperative cholangiogram,                            Suspected bile duct stone(s) Providers:                Gatha Mayer, MD, Benetta Spar RN, RN,                            William Dalton, Technician Referring MD:              Medicines:                Propofol per Anesthesia, Monitored Anesthesia Care,                            Unasyn 1.5 g IV, Indomethacin 580 mg PR Complications:            No immediate complications. Estimated Blood Loss:     Estimated blood loss: none. Procedure:                Pre-Anesthesia Assessment:                           - Prior to the procedure, a History and Physical                            was performed, and patient medications and                            allergies were reviewed. The patient's tolerance of                            previous anesthesia was also reviewed. The risks                            and benefits of the procedure and the sedation                            options and risks were discussed with the patient.                            All questions were answered, and informed consent                            was obtained. Prior Anticoagulants: The patient has                            taken no previous anticoagulant or antiplatelet  agents. ASA Grade Assessment: II - A patient with                            mild systemic disease. After reviewing the risks                            and benefits, the patient was deemed in                            satisfactory condition to undergo the procedure.                           After obtaining informed consent, the scope was                            passed under  direct vision. Throughout the                            procedure, the patient's blood pressure, pulse, and                            oxygen saturations were monitored continuously. The                            TJF-Q180V ([id number]) Olympus Duodenoscope was                            introduced through the mouth, and used to inject                            contrast into and used to inject contrast into the                            bile duct. The ERCP was somewhat difficult due to                            challenging cannulation. The patient tolerated the                            procedure well. Findings:      A scout film of the abdomen was obtained. Surgical clips, consistent       with a previous cholecystectomy, were seen in the area of the right       upper quadrant of the abdomen. The esophagus was successfully intubated       under direct vision. The scope was advanced to a normal major papilla in       the descending duodenum without detailed examination of the pharynx,       larynx and associated structures, and upper GI tract. The upper GI tract       was grossly normal. Small papilla - some difficulty cannulating -       eventually cannulated with a wire with sphincterotome unbowed. Contrast       revealed mildly dilated CBD max 11-12 mm. Given scenario - though no  stone seen on intital injection decided to do biliary sphincterotomy and       sweep with balloon. No stones removed/seen. Pancreas not entered by       intent. Impression:               - The entire main bile duct was mildly dilated. No                            stones seen or removed. Could have passed if was a                            stone on IOC or there could have been                            edema/stenosis of papilla. Moderate Sedation:      Not Applicable - Patient had care per Anesthesia. Recommendation:           - Return patient to hospital ward for ongoing care.                            - Clear liquid diet today - trend LFT's Home                            tomorrow if ready. Procedure Code(s):        --- Professional ---                           [phone], Endoscopic retrograde                            cholangiopancreatography (ERCP); with                            sphincterotomy/papillotomy Diagnosis Code(s):        --- Professional ---                           K83.8, Other specified diseases of biliary tract                           R93.2, Abnormal findings on diagnostic imaging of                            liver and biliary tract CPT copyright 2019 [employer]. All rights reserved. The codes documented in this report are preliminary and upon coder review may  be revised to meet current compliance requirements. Iva Boop, MD 06/25/2019 10:48:30 AM This report has been signed electronically. Number of Addenda: 0

## 2019-06-25 NOTE — Transfer of Care (Signed)
Immediate Anesthesia Transfer of Care Note  Patient: Veronica Randall  Procedure(s) Performed: ENDOSCOPIC RETROGRADE CHOLANGIOPANCREATOGRAPHY (ERCP) (N/A ) SPHINCTEROTOMY  Patient Location: PACU and Endoscopy Unit  Anesthesia Type:General  Level of Consciousness: awake and alert   Airway & Oxygen Therapy: Patient Spontanous Breathing and Patient connected to face mask oxygen  Post-op Assessment: Report given to RN and Post -op Vital signs reviewed and stable  Post vital signs: Reviewed and stable  Last Vitals:  Vitals Value Taken Time  BP 123/67 06/25/19 1054  Temp    Pulse 73 06/25/19 1055  Resp 18 06/25/19 1055  SpO2 97 % 06/25/19 1055  Vitals shown include unvalidated device data.  Last Pain:  Vitals:   06/25/19 0910  TempSrc: Oral  PainSc: 3       Patients Stated Pain Goal: 3 (06/24/19 1045)  Complications: No complications documented.

## 2019-06-25 NOTE — Interval H&P Note (Signed)
History and Physical Interval Note:  06/25/2019 9:47 AM  Veronica Randall  has presented today for surgery, with the diagnosis of Positive IOC, distal CBD stone.  The various methods of treatment have been discussed with the patient and family. After consideration of risks, benefits and other options for treatment, the patient has consented to  Procedure(s): ENDOSCOPIC RETROGRADE CHOLANGIOPANCREATOGRAPHY (ERCP) (N/A) as a surgical intervention.  The patient's history has been reviewed, patient examined, no change in status, stable for surgery.  I have reviewed the patient's chart and labs.  Questions were answered to the patient's satisfaction.     Stan Head

## 2019-06-25 NOTE — Anesthesia Procedure Notes (Signed)
Date/Time: 06/25/2019 10:45 AM Performed by: Minerva Ends, CRNA Oxygen Delivery Method: Simple face mask Placement Confirmation: positive ETCO2 and breath sounds checked- equal and bilateral Dental Injury: Teeth and Oropharynx as per pre-operative assessment

## 2019-06-25 NOTE — Anesthesia Preprocedure Evaluation (Signed)
Anesthesia Evaluation  Patient identified by MRN, date of birth, ID band Patient awake    Reviewed: Allergy & Precautions, NPO status , Patient's Chart, lab work & pertinent test results  Airway Mallampati: I  TM Distance: >3 FB Neck ROM: Full    Dental   Pulmonary    Pulmonary exam normal        Cardiovascular Normal cardiovascular exam     Neuro/Psych    GI/Hepatic   Endo/Other    Renal/GU      Musculoskeletal   Abdominal   Peds  Hematology   Anesthesia Other Findings   Reproductive/Obstetrics                             Anesthesia Physical Anesthesia Plan  ASA: II  Anesthesia Plan: General   Post-op Pain Management:    Induction: Intravenous  PONV Risk Score and Plan: 3  Airway Management Planned:   Additional Equipment:   Intra-op Plan:   Post-operative Plan: Extubation in OR  Informed Consent: I have reviewed the patients History and Physical, chart, labs and discussed the procedure including the risks, benefits and alternatives for the proposed anesthesia with the patient or authorized representative who has indicated his/her understanding and acceptance.       Plan Discussed with: CRNA and Surgeon  Anesthesia Plan Comments:         Anesthesia Quick Evaluation

## 2019-06-25 NOTE — Anesthesia Procedure Notes (Signed)
Procedure Name: Intubation Date/Time: 06/25/2019 9:56 AM Performed by: Minerva Ends, CRNA Pre-anesthesia Checklist: Patient identified, Emergency Drugs available, Suction available and Patient being monitored Patient Re-evaluated:Patient Re-evaluated prior to induction Oxygen Delivery Method: Circle System Utilized Preoxygenation: Pre-oxygenation with 100% oxygen Induction Type: IV induction, Rapid sequence and Cricoid Pressure applied Laryngoscope Size: Miller and 2 Tube type: Oral Tube size: 7.0 mm Number of attempts: 1 Airway Equipment and Method: Oral airway and Stylet Placement Confirmation: ETT inserted through vocal cords under direct vision,  positive ETCO2 and breath sounds checked- equal and bilateral Secured at: 21 cm Tube secured with: Tape Dental Injury: Teeth and Oropharynx as per pre-operative assessment  Comments: Smooth Iv induction Ossey-- intubation AM CRNA atraumatic-- teeth and mouth as preop-- bilat BS Ossey

## 2019-06-25 NOTE — Anesthesia Postprocedure Evaluation (Signed)
Anesthesia Post Note  Patient: Veronica Randall  Procedure(s) Performed: ENDOSCOPIC RETROGRADE CHOLANGIOPANCREATOGRAPHY (ERCP) (N/A ) SPHINCTEROTOMY     Patient location during evaluation: PACU Anesthesia Type: General Level of consciousness: awake and alert Pain management: pain level controlled Vital Signs Assessment: post-procedure vital signs reviewed and stable Respiratory status: spontaneous breathing, nonlabored ventilation, respiratory function stable and patient connected to nasal cannula oxygen Cardiovascular status: blood pressure returned to baseline and stable Postop Assessment: no apparent nausea or vomiting Anesthetic complications: no   No complications documented.  Last Vitals:  Vitals:   06/25/19 1100 06/25/19 1110  BP: 131/75 120/60  Pulse: 65 63  Resp: 19 (!) 21  Temp:    SpO2: 94% 94%    Last Pain:  Vitals:   06/25/19 1110  TempSrc:   PainSc: 0-No pain                 Shenea Giacobbe DAVID

## 2019-06-26 ENCOUNTER — Encounter (HOSPITAL_COMMUNITY): Payer: Self-pay | Admitting: Internal Medicine

## 2019-06-26 LAB — CBC
HCT: 39.2 % (ref 36.0–46.0)
Hemoglobin: 12.4 g/dL (ref 12.0–15.0)
MCH: 28.3 pg (ref 26.0–34.0)
MCHC: 31.6 g/dL (ref 30.0–36.0)
MCV: 89.5 fL (ref 80.0–100.0)
Platelets: 288 10*3/uL (ref 150–400)
RBC: 4.38 MIL/uL (ref 3.87–5.11)
RDW: 13.9 % (ref 11.5–15.5)
WBC: 8.4 10*3/uL (ref 4.0–10.5)
nRBC: 0 % (ref 0.0–0.2)

## 2019-06-26 LAB — COMPREHENSIVE METABOLIC PANEL
ALT: 157 U/L — ABNORMAL HIGH (ref 0–44)
AST: 57 U/L — ABNORMAL HIGH (ref 15–41)
Albumin: 3.3 g/dL — ABNORMAL LOW (ref 3.5–5.0)
Alkaline Phosphatase: 121 U/L (ref 38–126)
Anion gap: 10 (ref 5–15)
BUN: 13 mg/dL (ref 6–20)
CO2: 26 mmol/L (ref 22–32)
Calcium: 8.8 mg/dL — ABNORMAL LOW (ref 8.9–10.3)
Chloride: 102 mmol/L (ref 98–111)
Creatinine, Ser: 0.81 mg/dL (ref 0.44–1.00)
GFR calc Af Amer: >60 mL/min (ref >60)
GFR calc non Af Amer: >60 mL/min (ref >60)
Glucose, Bld: 120 mg/dL — ABNORMAL HIGH (ref 70–99)
Potassium: 3.9 mmol/L (ref 3.5–5.1)
Sodium: 138 mmol/L (ref 135–145)
Total Bilirubin: 1.5 mg/dL — ABNORMAL HIGH (ref 0.3–1.2)
Total Protein: 7.5 g/dL (ref 6.5–8.1)

## 2019-06-26 LAB — MAGNESIUM: Magnesium: 2.1 mg/dL (ref 1.7–2.4)

## 2019-06-26 LAB — SURGICAL PATHOLOGY

## 2019-06-26 LAB — LIPASE, BLOOD: Lipase: 86 U/L — ABNORMAL HIGH (ref 11–51)

## 2019-06-26 LAB — GLUCOSE, CAPILLARY
Glucose-Capillary: 101 mg/dL — ABNORMAL HIGH (ref 70–99)
Glucose-Capillary: 112 mg/dL — ABNORMAL HIGH (ref 70–99)

## 2019-06-26 MED ORDER — TRAMADOL HCL 50 MG PO TABS: 50.0000 mg | ORAL_TABLET | Freq: Four times a day (QID) | ORAL | 0 refills | Status: AC | PRN

## 2019-06-26 MED ORDER — ONDANSETRON 4 MG PO TBDP: 4.0000 mg | ORAL_TABLET | Freq: Three times a day (TID) | ORAL | 0 refills | Status: AC | PRN

## 2019-06-26 MED ORDER — POLYETHYLENE GLYCOL 3350 17 G PO PACK: 17.0000 g | PACK | Freq: Every day | ORAL | 0 refills | Status: AC | PRN

## 2019-06-26 MED ORDER — SENNOSIDES-DOCUSATE SODIUM 8.6-50 MG PO TABS: 2.0000 | ORAL_TABLET | Freq: Every evening | ORAL | 0 refills | Status: AC | PRN

## 2019-06-26 MED ORDER — ACETAMINOPHEN 500 MG PO TABS: 1000.0000 mg | ORAL_TABLET | Freq: Four times a day (QID) | ORAL | 0 refills | Status: AC | PRN

## 2019-06-26 NOTE — Discharge Summary (Signed)
Physician Discharge Summary  Veronica Randall FWY:[id number] DOB: [birth year] DOA: 06/22/2019  PCP: Patient, No Pcp Per  Admit date: 06/22/2019 Discharge date: 06/26/2019  Admitted From: Home Disposition: Home  Recommendations for Outpatient Follow-up:  1. Follow up with PCP in 1-2 weeks 2. Please obtain BMP/CBC in one week your next doctors visit.  3. Follow-up outpatient general surgery per their recommendation 4. Low-fat bland diet recommended over the next several days. 5. Avoid heavy lifting for now until follow-up with general surgery  Home Health: None Equipment/Devices: None Discharge Condition: Stable CODE STATUS: Full Diet recommendation: Low-fat, bland diet and slowly advance to regular over 1 week  Brief/Interim Summary: 34 year old with no significant medical history presents to the hospital with complaints of abdominal pain found to have gallstone pancreatitis.  Had transaminitis with elevated total bilirubin.  General surgery consulted. S/p Lap Chole 6/13 and ERCP pm 6/14. Tolerated well.   Transaminitis and total bilirubin improved.  Tolerating oral without any issues, stable for discharge today.   Assessment & Plan:   Principal Problem:   Acute gallstone pancreatitis Active Problems:   Dehydration with hyponatremia   Hyperglycemia   Class 3 severe obesity due to excess calories without serious comorbidity with body mass index (BMI) of 40.0 to 44.9 in adult [facility])   GERD without esophagitis   Hypokalemia due to excessive gastrointestinal loss of potassium   Abnormal cholangiogram  Acute gallstone pancreatitis Mild to moderate dehydration with hyponatremia- resolved.  -s/p Lap chole 6/13, ERCP on 6/14.  LFTs, lipase improved.  Tolerating oral diet.  Stable for discharge today with outpatient recommendations as stated above.  Hyperglycemia -Hemoglobin A1c-5.8; accuchecks  Obesity with BMI greater than 40 -Outpatient follow-up with PCP, recommend weight loss diet  and exercise  GERD without esophagitis -PPI     Discharge Diagnoses:  Principal Problem:   Acute gallstone pancreatitis Active Problems:   Dehydration with hyponatremia   Hyperglycemia   Class 3 severe obesity due to excess calories without serious comorbidity with body mass index (BMI) of 40.0 to 44.9 in adult [facility])   GERD without esophagitis   Hypokalemia due to excessive gastrointestinal loss of potassium   Abnormal cholangiogram    Consultations:  General surgery  GI  Subjective: Doing well no complaints.  Discharge Exam: Vitals:   06/25/19 2003 06/26/19 0646  BP: 109/77 96/61  Pulse: 70 (!) 55  Resp: 18 18  Temp: 98.5 F (36.9 C) 98.1 F (36.7 C)  SpO2: 95% 98%   Vitals:   06/25/19 1110 06/25/19 1622 06/25/19 2003 06/26/19 0646  BP: 120/60 116/69 109/77 96/61  Pulse: 63 68 70 (!) 55  Resp: (!) 21 17 18 18   Temp:  98.4 F (36.9 C) 98.5 F (36.9 C) 98.1 F (36.7 C)  TempSrc:  Oral Oral Oral  SpO2: 94% 96% 95% 98%  Weight:      Height:        General: Pt is alert, awake, not in acute distress Cardiovascular: RRR, S1/S2 +, no rubs, no gallops Respiratory: CTA bilaterally, no wheezing, no rhonchi Abdominal: Soft, NT, ND, bowel sounds + Extremities: no edema, no cyanosis Surgical incision site looks okay.  Discharge Instructions   Allergies as of 06/26/2019   No Known Allergies     Medication List    STOP taking these medications   HYDROcodone-acetaminophen 5-325 MG tablet Commonly known as: NORCO/VICODIN     TAKE these medications   acetaminophen 500 MG tablet Commonly known as: TYLENOL Take 2 tablets (1,000 mg  total) by mouth every 6 (six) hours as needed for mild pain or headache.   Multivitamin Adult Chew Chew 1 tablet by mouth daily.   ondansetron 4 MG disintegrating tablet Commonly known as: ZOFRAN-ODT Take 1 tablet (4 mg total) by mouth every 8 (eight) hours as needed for up to 7 days for nausea or vomiting.    polyethylene glycol 17 g packet Commonly known as: MIRALAX / GLYCOLAX Take 17 g by mouth daily as needed for moderate constipation or severe constipation.   senna-docusate 8.6-50 MG tablet Commonly known as: Senokot-S Take 2 tablets by mouth at bedtime as needed for mild constipation or moderate constipation.   traMADol 50 MG tablet Commonly known as: ULTRAM Take 1 tablet (50 mg total) by mouth every 6 (six) hours as needed for moderate pain.       Follow-up Information    [facility] [place] Follow up on 07/17/2019.   Specialty: General Surgery Why: 9 am, Please arrive 30 minutes prior to your appointment time.  please bring your insurance card and photo ID Contact information: [address] [place] [address] [phone]              No Known Allergies  You were cared for by a hospitalist during your hospital stay. If you have any questions about your discharge medications or the care you received while you were in the hospital after you are discharged, you can call the unit and asked to speak with the hospitalist on call if the hospitalist that took care of you is not available. Once you are discharged, your primary care physician will handle any further medical issues. Please note that no refills for any discharge medications will be authorized once you are discharged, as it is imperative that you return to your primary care physician (or establish a relationship with a primary care physician if you do not have one) for your aftercare needs so that they can reassess your need for medications and monitor your lab values.   Procedures/Studies: DG Cholangiogram Operative  Result Date: 06/24/2019 CLINICAL DATA:  Cholelithiasis, intraoperative cholangiogram EXAM: INTRAOPERATIVE CHOLANGIOGRAM TECHNIQUE: Cholangiographic images from the C-arm fluoroscopic device were submitted for interpretation post-operatively. Please see the procedural report for the amount  of contrast and the fluoroscopy time utilized. COMPARISON:  06/21/2019, 06/22/2019 FINDINGS: Intraoperative cholangiogram performed during the laparoscopic procedure. The residual cystic duct, biliary confluence, common hepatic duct, and common bile duct are patent. However, distal CBD small round filling defect persist on several images. Only a small amount of contrast passes through the distal CBD into the duodenum. Difficult to exclude distal choledocholithiasis. IMPRESSION: Possible distal CBD choledocholithiasis. Electronically Signed   By: Jerilynn Mages.  Shick M.D.   On: 06/24/2019 11:36   CT ABDOMEN PELVIS W CONTRAST  Result Date: 06/22/2019 CLINICAL DATA:  Pancreatitis.  Epigastric pain. EXAM: CT ABDOMEN AND PELVIS WITH CONTRAST TECHNIQUE: Multidetector CT imaging of the abdomen and pelvis was performed using the standard protocol following bolus administration of intravenous contrast. CONTRAST:  112mL OMNIPAQUE IOHEXOL 300 MG/ML  SOLN COMPARISON:  Abdominal ultrasound yesterday. FINDINGS: Lower chest: Lung bases are clear.  No pleural fluid. Hepatobiliary: Diffusely decreased hepatic density consistent with steatosis. Ill-defined central hypodense area measuring 2 cm common series 2, image 18, may represent more focal fatty infiltration versus underlying lesion. Gallstones on ultrasound are not well-defined by CT. Common bile duct appears mildly dilated measuring 8 mm. No intrahepatic biliary ductal dilatation. Pancreas: Moderate peripancreatic fat stranding about the  distal body and tail. No ductal dilatation. Homogeneous enhancement without necrosis. No acute peripancreatic collection. No evidence of underlying pancreatic mass. Spleen: Normal in size without focal abnormality. Splenule at the hilum. Adrenals/Urinary Tract: Normal adrenal glands. No hydronephrosis or perinephric edema. Homogeneous renal enhancement. Urinary bladder is physiologically distended without wall thickening. Stomach/Bowel: Stomach and  duodenum are unremarkable. No small bowel obstruction or dilatation. There is slight fecalization of distal small bowel contents. Normal appendix. Small to moderate volume of stool throughout the colon. No colonic wall thickening. Vascular/Lymphatic: Patent portal and splenic veins. Patent mesenteric veins. Normal caliber abdominal aorta. Few prominent central mesenteric nodes are likely reactive. Reproductive: Physiologic corpus luteal cyst on the right. No adnexal mass. Uterus is unremarkable. Other: Peripancreatic fat stranding with minimal adjacent fluid. No organized fluid collection. No ascites elsewhere. No free air. Small fat containing umbilical hernia. Musculoskeletal: There are no acute or suspicious osseous abnormalities. Degenerative change of the pubic symphysis. IMPRESSION: 1. Acute edematous pancreatitis. No acute peripancreatic collection or pancreatic necrosis. 2. Hepatic steatosis. Ill-defined central hypodense area measuring 2 cm may represent more focal fatty infiltration versus underlying lesion. 3. Mild common bile duct dilatation measuring 8 mm. No intrahepatic biliary ductal dilatation. Gallstones on ultrasound are not well-defined by CT. 4. Small fat containing umbilical hernia. Electronically Signed   By: Narda RutherfordMelanie  Sanford M.D.   On: 06/22/2019 22:55   DG ERCP BILIARY & PANCREATIC DUCTS  Result Date: 06/25/2019 CLINICAL DATA:  Acute gallstone pancreatitis. EXAM: ERCP TECHNIQUE: Multiple spot images obtained with the fluoroscopic device and submitted for interpretation post-procedure. COMPARISON:  CT abdomen and pelvis-06/22/2019; cholangio graphic images during intraoperative cholangiogram-06/24/2019 FLUOROSCOPY TIME:  1 minutes, 42 seconds FINDINGS: 13 spot intraoperative fluoroscopic images of the right upper abdominal quadrant during ERCP are provided for review Initial image demonstrates an ERCP probe overlying the right upper abdominal quadrant. Cholecystectomy clips overlies  expected location of the gallbladder fossa Subsequent images demonstrate selective cannulation and opacification of the common bile duct which appears moderately dilated. There are no definitive persistent filling defect in the pacified portion of the biliary tree, though note, evaluation the distal most aspect of the CBD is degraded secondary to overlying radiopaque surgical support material. Subsequent images demonstrate insufflation of a balloon within the central aspect of the biliary tree with subsequent biliary sweeping and presumed sphincterotomy. IMPRESSION: ERCP with biliary sweeping and presumed sphincterotomy as above. These images were submitted for radiologic interpretation only. Please see the procedural report for the amount of contrast and the fluoroscopy time utilized. Electronically Signed   By: Simonne ComeJohn  Watts M.D.   On: 06/25/2019 11:51      The results of significant diagnostics from this hospitalization (including imaging, microbiology, ancillary and laboratory) are listed below for reference.     Microbiology: Recent Results (from the past 240 hour(s))  SARS Coronavirus 2 by RT PCR (hospital order, performed in [facility] hospital lab) Nasopharyngeal Nasopharyngeal Swab     Status: None   Collection Time: 06/22/19 11:13 AM   Specimen: Nasopharyngeal Swab  Result Value Ref Range Status   SARS Coronavirus 2 NEGATIVE NEGATIVE Final    Comment: (NOTE) SARS-CoV-2 target nucleic acids are NOT DETECTED.  The SARS-CoV-2 RNA is generally detectable in upper and lower respiratory specimens during the acute phase of infection. The lowest concentration of SARS-CoV-2 viral copies this assay can detect is 250 copies / mL. A negative result does not preclude SARS-CoV-2 infection and should not be used as the sole basis for treatment  or other patient management decisions.  A negative result may occur with improper specimen collection / handling, submission of specimen other than  nasopharyngeal swab, presence of viral mutation(s) within the areas targeted by this assay, and inadequate number of viral copies (<250 copies / mL). A negative result must be combined with clinical observations, patient history, and epidemiological information.  Fact Sheet for Patients:   BoilerBrush.com.cy  Fact Sheet for Healthcare Providers: https://pope.com/  This test is not yet approved or  cleared by the [place] FDA and has been authorized for detection and/or diagnosis of SARS-CoV-2 by FDA under an Emergency Use Authorization (EUA).  This EUA will remain in effect (meaning this test can be used) for the duration of the COVID-19 declaration under Section 564(b)(1) of the Act, 21 U.S.C. section 360bbb-3(b)(1), unless the authorization is terminated or revoked sooner.  Performed at [facility], [address] [place], [place] [address]      Labs: BNP (last 3 results) No results for input(s): BNP in the last 8760 hours. Basic Metabolic Panel: Recent Labs  Lab 06/22/19 2204 06/22/19 2204 06/23/19 0549 06/24/19 0600 06/24/19 1202 06/25/19 0540 06/26/19 0537  NA 135   < > 135 134* 135 140 138  K 3.9   < > 3.8 3.7 3.7 3.9 3.9  CL 97*   < > 101 99 99 100 102  CO2 25   < > 23 22 23 26 26   GLUCOSE 129*   < > 112* 110* 129* 111* 120*  BUN 7   < > 7 6 6 9 13   CREATININE 0.58   < > 0.60 0.67 0.61 0.80 0.81  CALCIUM 9.2   < > 9.1 9.3 9.2 8.9 8.8*  MG 1.9  --  1.9 1.8  --  2.0 2.1  PHOS 3.0  --   --   --   --   --   --    < > = values in this interval not displayed.   Liver Function Tests: Recent Labs  Lab 06/23/19 0549 06/24/19 0600 06/24/19 1202 06/25/19 0540 06/26/19 0537  AST 122* 114* 197* 103* 57*  ALT 274* 243* 279* 211* 157*  ALKPHOS 130* 153* 158* 133* 121  BILITOT 4.4* 5.8* 5.6* 2.3* 1.5*  PROT 6.8 7.4 7.4 7.2 7.5  ALBUMIN 3.4* 3.6 3.5 3.2* 3.3*   Recent Labs  Lab 06/22/19 0849  06/23/19 0549 06/24/19 0600 06/26/19 0537  LIPASE 7,228* 372* 1,184* 86*   No results for input(s): AMMONIA in the last 168 hours. CBC: Recent Labs  Lab 06/22/19 0849 06/23/19 0549 06/24/19 0600 06/25/19 0540 06/26/19 0537  WBC 8.0 7.9 8.8 8.4 8.4  NEUTROABS 4.9 5.8  --   --   --   HGB 13.3 12.6 13.0 12.0 12.4  HCT 42.1 38.9 40.9 37.7 39.2  MCV 87.5 87.0 88.5 90.0 89.5  PLT 288 285 299 270 288   Cardiac Enzymes: No results for input(s): CKTOTAL, CKMB, CKMBINDEX, TROPONINI in the last 168 hours. BNP: Invalid input(s): POCBNP CBG: Recent Labs  Lab 06/24/19 0544 06/25/19 1209 06/25/19 1834 06/26/19 0014 06/26/19 0647  GLUCAP 106* 148* 137* 112* 101*   D-Dimer No results for input(s): DDIMER in the last 72 hours. Hgb A1c No results for input(s): HGBA1C in the last 72 hours. Lipid Profile No results for input(s): CHOL, HDL, LDLCALC, TRIG, CHOLHDL, LDLDIRECT in the last 72 hours. Thyroid function studies No results for input(s): TSH, T4TOTAL, T3FREE, THYROIDAB in the last 72  hours.  Invalid input(s): FREET3 Anemia work up No results for input(s): VITAMINB12, FOLATE, FERRITIN, TIBC, IRON, RETICCTPCT in the last 72 hours. Urinalysis    Component Value Date/Time   COLORURINE YELLOW 06/22/2019 1100   APPEARANCEUR CLEAR 06/22/2019 1100   LABSPEC 1.020 06/22/2019 1100   PHURINE 6.0 06/22/2019 1100   GLUCOSEU NEGATIVE 06/22/2019 1100   HGBUR NEGATIVE 06/22/2019 1100   BILIRUBINUR MODERATE (A) 06/22/2019 1100   KETONESUR NEGATIVE 06/22/2019 1100   PROTEINUR NEGATIVE 06/22/2019 1100   NITRITE NEGATIVE 06/22/2019 1100   LEUKOCYTESUR TRACE (A) 06/22/2019 1100   Sepsis Labs Invalid input(s): PROCALCITONIN,  WBC,  LACTICIDVEN Microbiology Recent Results (from the past 240 hour(s))  SARS Coronavirus 2 by RT PCR (hospital order, performed in [facility] hospital lab) Nasopharyngeal Nasopharyngeal Swab     Status: None   Collection Time: 06/22/19 11:13 AM   Specimen:  Nasopharyngeal Swab  Result Value Ref Range Status   SARS Coronavirus 2 NEGATIVE NEGATIVE Final    Comment: (NOTE) SARS-CoV-2 target nucleic acids are NOT DETECTED.  The SARS-CoV-2 RNA is generally detectable in upper and lower respiratory specimens during the acute phase of infection. The lowest concentration of SARS-CoV-2 viral copies this assay can detect is 250 copies / mL. A negative result does not preclude SARS-CoV-2 infection and should not be used as the sole basis for treatment or other patient management decisions.  A negative result may occur with improper specimen collection / handling, submission of specimen other than nasopharyngeal swab, presence of viral mutation(s) within the areas targeted by this assay, and inadequate number of viral copies (<250 copies / mL). A negative result must be combined with clinical observations, patient history, and epidemiological information.  Fact Sheet for Patients:   BoilerBrush.com.cy  Fact Sheet for Healthcare Providers: https://pope.com/  This test is not yet approved or  cleared by the [place] FDA and has been authorized for detection and/or diagnosis of SARS-CoV-2 by FDA under an Emergency Use Authorization (EUA).  This EUA will remain in effect (meaning this test can be used) for the duration of the COVID-19 declaration under Section 564(b)(1) of the Act, 21 U.S.C. section 360bbb-3(b)(1), unless the authorization is terminated or revoked sooner.  Performed at [facility], [address] [place], [place] [address]      Time coordinating discharge:  I have spent 35 minutes face to face with the patient and on the ward discussing the patients care, assessment, plan and disposition with other care givers. >50% of the time was devoted counseling the patient about the risks and benefits of treatment/Discharge disposition and coordinating care.    SIGNED:   Dimple Nanas, MD  [facility] 06/26/2019, 9:08 AM   If 7PM-7AM, please contact night-coverage

## 2019-06-26 NOTE — Progress Notes (Signed)
Patient ID: Veronica Randall, female   DOB: June 12, 1985, 34 y.o.   MRN: [id number]    1 Day Post-Op  Subjective: Patient doing well s/p ERCP.  Pain well controlled.  Eating well.  No nausea.  ROS: See above, otherwise other systems negative  Objective: Vital signs in last 24 hours: Temp:  [98.1 F (36.7 C)-99 F (37.2 C)] 98.1 F (36.7 C) (06/15 0646) Pulse Rate:  [55-78] 55 (06/15 0646) Resp:  [17-25] 18 (06/15 0646) BP: (96-131)/(57-77) 96/61 (06/15 0646) SpO2:  [94 %-98 %] 98 % (06/15 0646) Last BM Date: 06/22/19  Intake/Output from previous day: 06/14 0701 - 06/15 0700 In: 960 [P.O.:360; I.V.:500; IV Piggyback:100] Out: 0  Intake/Output this shift: No intake/output data recorded.  PE: Abd: soft, appropriately tender, incisions c/d/i, +BS, ND  Lab Results:  Recent Labs    06/25/19 0540 06/26/19 0537  WBC 8.4 8.4  HGB 12.0 12.4  HCT 37.7 39.2  PLT 270 288   BMET Recent Labs    06/25/19 0540 06/26/19 0537  NA 140 138  K 3.9 3.9  CL 100 102  CO2 26 26  GLUCOSE 111* 120*  BUN 9 13  CREATININE 0.80 0.81  CALCIUM 8.9 8.8*   PT/INR No results for input(s): LABPROT, INR in the last 72 hours. CMP     Component Value Date/Time   NA 138 06/26/2019 0537   K 3.9 06/26/2019 0537   CL 102 06/26/2019 0537   CO2 26 06/26/2019 0537   GLUCOSE 120 (H) 06/26/2019 0537   BUN 13 06/26/2019 0537   CREATININE 0.81 06/26/2019 0537   CALCIUM 8.8 (L) 06/26/2019 0537   PROT 7.5 06/26/2019 0537   ALBUMIN 3.3 (L) 06/26/2019 0537   AST 57 (H) 06/26/2019 0537   ALT 157 (H) 06/26/2019 0537   ALKPHOS 121 06/26/2019 0537   BILITOT 1.5 (H) 06/26/2019 0537   GFRNONAA >60 06/26/2019 0537   GFRAA >60 06/26/2019 0537   Lipase     Component Value Date/Time   LIPASE 86 (H) 06/26/2019 0537       Studies/Results: DG Cholangiogram Operative  Result Date: 06/24/2019 CLINICAL DATA:  Cholelithiasis, intraoperative cholangiogram EXAM: INTRAOPERATIVE CHOLANGIOGRAM TECHNIQUE:  Cholangiographic images from the C-arm fluoroscopic device were submitted for interpretation post-operatively. Please see the procedural report for the amount of contrast and the fluoroscopy time utilized. COMPARISON:  06/21/2019, 06/22/2019 FINDINGS: Intraoperative cholangiogram performed during the laparoscopic procedure. The residual cystic duct, biliary confluence, common hepatic duct, and common bile duct are patent. However, distal CBD small round filling defect persist on several images. Only a small amount of contrast passes through the distal CBD into the duodenum. Difficult to exclude distal choledocholithiasis. IMPRESSION: Possible distal CBD choledocholithiasis. Electronically Signed   By: Jerilynn Mages.  Shick M.D.   On: 06/24/2019 11:36   DG ERCP BILIARY & PANCREATIC DUCTS  Result Date: 06/25/2019 CLINICAL DATA:  Acute gallstone pancreatitis. EXAM: ERCP TECHNIQUE: Multiple spot images obtained with the fluoroscopic device and submitted for interpretation post-procedure. COMPARISON:  CT abdomen and pelvis-06/22/2019; cholangio graphic images during intraoperative cholangiogram-06/24/2019 FLUOROSCOPY TIME:  1 minutes, 42 seconds FINDINGS: 13 spot intraoperative fluoroscopic images of the right upper abdominal quadrant during ERCP are provided for review Initial image demonstrates an ERCP probe overlying the right upper abdominal quadrant. Cholecystectomy clips overlies expected location of the gallbladder fossa Subsequent images demonstrate selective cannulation and opacification of the common bile duct which appears moderately dilated. There are no definitive persistent filling defect in the pacified portion of the biliary  tree, though note, evaluation the distal most aspect of the CBD is degraded secondary to overlying radiopaque surgical support material. Subsequent images demonstrate insufflation of a balloon within the central aspect of the biliary tree with subsequent biliary sweeping and presumed  sphincterotomy. IMPRESSION: ERCP with biliary sweeping and presumed sphincterotomy as above. These images were submitted for radiologic interpretation only. Please see the procedural report for the amount of contrast and the fluoroscopy time utilized. Electronically Signed   By: Simonne Come M.D.   On: 06/25/2019 11:51    Anti-infectives: Anti-infectives (From admission, onward)   Start     Dose/Rate Route Frequency Ordered Stop   06/25/19 0915  ampicillin-sulbactam (UNASYN) 1.5 g in sodium chloride 0.9 % 100 mL IVPB        1.5 g 200 mL/hr over 30 Minutes Intravenous  Once 06/25/19 0906 06/25/19 1027   06/24/19 0802  ceFAZolin (ANCEF) 2-4 GM/100ML-% IVPB       Note to Pharmacy: Mirian Mo   : cabinet override      06/24/19 0802 06/24/19 0833   06/24/19 0600  ceFAZolin (ANCEF) IVPB 2g/100 mL premix        2 g 200 mL/hr over 30 Minutes Intravenous On call to O.R. 06/23/19 0734 06/24/19 0858       Assessment/Plan POD 2, s/p lap chole with +IOC, gallstone pancreatitis, Dr. Fredricka Bonine 6/13 -LFTs normalizing -tolerating diet -pain well controlled -surgically stable for DC home -follow up being arranged -DC instructions given -Rx sent to pharmacy. -relayed surgically stable for DC to primary servicce  FEN - regular diet VTE - heparin ID - ancef, preop only   LOS: 4 days    Letha Cape , [facility] 06/26/2019, 7:57 AM Please see Amion for pager number during day hours 7:00am-4:30pm or 7:00am -11:30am on weekends

## 2019-06-26 NOTE — Discharge Instructions (Signed)
[facility]  Please arrive at least 30 min before your appointment to complete your check in paperwork.  If you are unable to arrive 30 min prior to your appointment time we may have to cancel or reschedule you. LAPAROSCOPIC SURGERY: POST OP INSTRUCTIONS Always review your discharge instruction sheet given to you by the facility where your surgery was performed. IF YOU HAVE DISABILITY OR FAMILY LEAVE FORMS, YOU MUST BRING THEM TO THE OFFICE FOR PROCESSING.   DO NOT GIVE THEM TO YOUR DOCTOR.  PAIN CONTROL  1. First take acetaminophen (Tylenol) AND/or ibuprofen (Advil) to control your pain after surgery.  Follow directions on package.  Taking acetaminophen (Tylenol) and/or ibuprofen (Advil) regularly after surgery will help to control your pain and lower the amount of prescription pain medication you may need.  You should not take more than 4,000 mg (4 grams) of acetaminophen (Tylenol) in 24 hours.  You should not take ibuprofen (Advil), aleve, motrin, naprosyn or other NSAIDS if you have a history of stomach ulcers or chronic kidney disease.  2. A prescription for pain medication may be given to you upon discharge.  Take your pain medication as prescribed, if you still have uncontrolled pain after taking acetaminophen (Tylenol) or ibuprofen (Advil). 3. Use ice packs to help control pain. 4. If you need a refill on your pain medication, please contact your pharmacy.  They will contact our office to request authorization. Prescriptions will not be filled after 5pm or on week-ends.  HOME MEDICATIONS 5. Take your usually prescribed medications unless otherwise directed.  DIET 6. You should follow a light diet the first few days after arrival home.  Be sure to include lots of fluids daily. Avoid fatty, fried foods.   CONSTIPATION 7. It is common to experience some constipation after surgery and if you are taking pain medication.  Increasing fluid intake and taking a stool  softener (such as Colace) will usually help or prevent this problem from occurring.  A mild laxative (Milk of Magnesia or Miralax) should be taken according to package instructions if there are no bowel movements after 48 hours.  WOUND/INCISION CARE 8. Most patients will experience some swelling and bruising in the area of the incisions.  Ice packs will help.  Swelling and bruising can take several days to resolve.  9. Unless discharge instructions indicate otherwise, follow guidelines below  a. STERI-STRIPS - you may remove your outer bandages 48 hours after surgery, and you may shower at that time.  You have steri-strips (small skin tapes) in place directly over the incision.  These strips should be left on the skin for 7-10 days.   b. DERMABOND/SKIN GLUE - you may shower in 24 hours.  The glue will flake off over the next 2-3 weeks. 10. Any sutures or staples will be removed at the office during your follow-up visit.  ACTIVITIES 11. You may resume regular (light) daily activities beginning the next day--such as daily self-care, walking, climbing stairs--gradually increasing activities as tolerated.  You may have sexual intercourse when it is comfortable.  Refrain from any heavy lifting or straining until approved by your doctor. a. You may drive when you are no longer taking prescription pain medication, you can comfortably wear a seatbelt, and you can safely maneuver your car and apply brakes.  FOLLOW-UP 12. You should see your doctor in the office for a follow-up appointment approximately 2-3 weeks after your surgery.  You should have been given your post-op/follow-up appointment when   your surgery was scheduled.  If you did not receive a post-op/follow-up appointment, make sure that you call for this appointment within a day or two after you arrive home to insure a convenient appointment time.   WHEN TO CALL YOUR DOCTOR: 1. Fever over 101.0 2. Inability to urinate 3. Continued bleeding from  incision. 4. Increased pain, redness, or drainage from the incision. 5. Increasing abdominal pain  The clinic staff is available to answer your questions during regular business hours.  Please don't hesitate to call and ask to speak to one of the nurses for clinical concerns.  If you have a medical emergency, go to the nearest emergency room or call 911.  A surgeon from [facility] is always on call at the hospital. [address], [address], [place], [place]  [address] ? [address] [address], [place], [place]   [address] [phone] ? [phone] ? FAX [phone]  .........   Managing Your Pain After Surgery Without Opioids    Thank you for participating in our program to help patients manage their pain after surgery without opioids. This is part of our effort to provide you with the best care possible, without exposing you or your family to the risk that opioids pose.  What pain can I expect after surgery? You can expect to have some pain after surgery. This is normal. The pain is typically worse the day after surgery, and quickly begins to get better. Many studies have found that many patients are able to manage their pain after surgery with Over-the-Counter (OTC) medications such as Tylenol and Motrin. If you have a condition that does not allow you to take Tylenol or Motrin, notify your surgical team.  How will I manage my pain? The best strategy for controlling your pain after surgery is around the clock pain control with Tylenol (acetaminophen) and Motrin (ibuprofen or Advil). Alternating these medications with each other allows you to maximize your pain control. In addition to Tylenol and Motrin, you can use heating pads or ice packs on your incisions to help reduce your pain.  How will I alternate your regular strength over-the-counter pain medication? You will take a dose of pain medication every three hours. ; Start by taking 650 mg of Tylenol (2 pills of 325  mg) ; 3 hours later take 600 mg of Motrin (3 pills of 200 mg) ; 3 hours after taking the Motrin take 650 mg of Tylenol ; 3 hours after that take 600 mg of Motrin.   - 1 -  See example - if your first dose of Tylenol is at 12:00 PM   12:00 PM Tylenol 650 mg (2 pills of 325 mg)  3:00 PM Motrin 600 mg (3 pills of 200 mg)  6:00 PM Tylenol 650 mg (2 pills of 325 mg)  9:00 PM Motrin 600 mg (3 pills of 200 mg)  Continue alternating every 3 hours   We recommend that you follow this schedule around-the-clock for at least 3 days after surgery, or until you feel that it is no longer needed. Use the table on the last page of this handout to keep track of the medications you are taking. Important: Do not take more than 3000mg of Tylenol or 3200mg of Motrin in a 24-hour period. Do not take ibuprofen/Motrin if you have a history of bleeding stomach ulcers, severe kidney disease, &/or actively taking a blood thinner  What if I still have pain? If you have pain that is not   controlled with the over-the-counter pain medications (Tylenol and Motrin or Advil) you might have what we call "breakthrough" pain. You will receive a prescription for a small amount of an opioid pain medication such as Oxycodone, Tramadol, or Tylenol with Codeine. Use these opioid pills in the first 24 hours after surgery if you have breakthrough pain. Do not take more than 1 pill every 4-6 hours.  If you still have uncontrolled pain after using all opioid pills, don't hesitate to call our staff using the number provided. We will help make sure you are managing your pain in the best way possible, and if necessary, we can provide a prescription for additional pain medication.   Day 1    Time  Name of Medication Number of pills taken  Amount of Acetaminophen  Pain Level   Comments  AM PM       AM PM       AM PM       AM PM       AM PM       AM PM       AM PM       AM PM       Total Daily amount of Acetaminophen Do not  take more than  3,000 mg per day      Day 2    Time  Name of Medication Number of pills taken  Amount of Acetaminophen  Pain Level   Comments  AM PM       AM PM       AM PM       AM PM       AM PM       AM PM       AM PM       AM PM       Total Daily amount of Acetaminophen Do not take more than  3,000 mg per day      Day 3    Time  Name of Medication Number of pills taken  Amount of Acetaminophen  Pain Level   Comments  AM PM       AM PM       AM PM       AM PM          AM PM       AM PM       AM PM       AM PM       Total Daily amount of Acetaminophen Do not take more than  3,000 mg per day      Day 4    Time  Name of Medication Number of pills taken  Amount of Acetaminophen  Pain Level   Comments  AM PM       AM PM       AM PM       AM PM       AM PM       AM PM       AM PM       AM PM       Total Daily amount of Acetaminophen Do not take more than  3,000 mg per day      Day 5    Time  Name of Medication Number of pills taken  Amount of Acetaminophen  Pain Level   Comments  AM PM       AM PM       AM   PM       AM PM       AM PM       AM PM       AM PM       AM PM       Total Daily amount of Acetaminophen Do not take more than  3,000 mg per day       Day 6    Time  Name of Medication Number of pills taken  Amount of Acetaminophen  Pain Level  Comments  AM PM       AM PM       AM PM       AM PM       AM PM       AM PM       AM PM       AM PM       Total Daily amount of Acetaminophen Do not take more than  3,000 mg per day      Day 7    Time  Name of Medication Number of pills taken  Amount of Acetaminophen  Pain Level   Comments  AM PM       AM PM       AM PM       AM PM       AM PM       AM PM       AM PM       AM PM       Total Daily amount of Acetaminophen Do not take more than  3,000 mg per day        For additional information about how and where to safely dispose of unused  opioid medications - [email or web address]  Disclaimer: This document contains information and/or instructional materials adapted from [facility] for the typical patient with your condition. It does not replace medical advice from your health care provider because your experience may differ from that of the typical patient. Talk to your health care provider if you have any questions about this document, your condition or your treatment plan. Adapted from [facility]     Acute Pancreatitis  Acute pancreatitis happens when the pancreas gets swollen. The pancreas is a large gland in the body that helps to control blood sugar. It also makes enzymes that help to digest food. This condition can last a few days and cause serious problems. The lungs, heart, and kidneys may stop working. What are the causes? Causes include:  Alcohol abuse.  Drug abuse.  Gallstones.  A tumor in the pancreas. Other causes include:  Some medicines.  Some chemicals.  Diabetes.  An infection.  Damage caused by an accident.  The poison (venom) from a scorpion bite.  Belly (abdominal) surgery.  The body's defense system (immune system) attacking the pancreas (autoimmune pancreatitis).  Genes that are passed from parent to child (inherited). In some cases, the cause is not known. What are the signs or symptoms?  Pain in the upper belly that may be felt in the back. The pain may be very bad.  Swelling of the belly.  Feeling sick to your stomach (nauseous) and throwing up (vomiting).  Fever. How is this treated? You will likely have to stay in the hospital. Treatment may include:  Pain medicine.  Fluid through an IV tube.  Placing a tube in the stomach to take out the stomach contents. This may help you stop throwing up.  Not  eating for 3-4 days.  Antibiotic medicines, if you have an infection.  Treating any other problems that may be the cause.  Steroid  medicines, if your problem is caused by your defense system attacking your body's own tissues.  Surgery. Follow these instructions at home: Eating and drinking   Follow instructions from your doctor about what to eat and drink.  Eat foods that do not have a lot of fat in them.  Eat small meals often. Do not eat big meals.  Drink enough fluid to keep your pee (urine) pale yellow.  Do not drink alcohol if it caused your condition. Medicines  Take over-the-counter and prescription medicines only as told by your doctor.  Ask your doctor if the medicine prescribed to you: ? Requires you to avoid driving or using heavy machinery. ? Can cause trouble pooping (constipation). You may need to take steps to prevent or treat trouble pooping:  Take over-the-counter or prescription medicines.  Eat foods that are high in fiber. These include beans, whole grains, and fresh fruits and vegetables.  Limit foods that are high in fat and sugar. These include fried or sweet foods. General instructions  Do not use any products that contain nicotine or tobacco, such as cigarettes, e-cigarettes, and chewing tobacco. If you need help quitting, ask your doctor.  Get plenty of rest.  Check your blood sugar at home as told by your doctor.  Keep all follow-up visits as told by your doctor. This is important. Contact a doctor if:  You do not get better as quickly as expected.  You have new symptoms.  Your symptoms get worse.  You have pain or weakness that lasts a long time.  You keep feeling sick to your stomach.  You get better and then you have pain again.  You have a fever. Get help right away if:  You cannot eat or keep fluids down.  Your pain gets very bad.  Your skin or the white part of your eyes turns yellow.  You have sudden swelling in your belly.  You throw up.  You feel dizzy or you pass out (faint).  Your blood sugar is high (over 300 mg/dL). Summary  Acute  pancreatitis happens when the pancreas gets swollen.  This condition is often caused by alcohol abuse, drug abuse, or gallstones.  You will likely have to stay in the hospital for treatment. This information is not intended to replace advice given to you by your health care provider. Make sure you discuss any questions you have with your health care provider. Document Revised: 10/17/2017 Document Reviewed: 10/17/2017 Elsevier Patient Education  2020 [employer].   Cholelithiasis  Cholelithiasis is also called "gallstones." It is a kind of gallbladder disease. The gallbladder is an organ that stores a liquid (bile) that helps you digest fat. Gallstones may not cause symptoms (may be silent gallstones) until they cause a blockage, and then they can cause pain (gallbladder attack). Follow these instructions at home:  Take over-the-counter and prescription medicines only as told by your doctor.  Stay at a healthy weight.  Eat healthy foods. This includes: ? Eating fewer fatty foods, like fried foods. ? Eating fewer refined carbs (refined carbohydrates). Refined carbs are breads and grains that are highly processed, like white bread and white rice. Instead, choose whole grains like whole-wheat bread and brown rice. ? Eating more fiber. Almonds, fresh fruit, and beans are healthy sources of fiber.  Keep all follow-up visits as told by your doctor.  This is important. Contact a doctor if:  You have sudden pain in the upper right side of your belly (abdomen). Pain might spread to your right shoulder or your chest. This may be a sign of a gallbladder attack.  You feel sick to your stomach (are nauseous).  You throw up (vomit).  You have been diagnosed with gallstones that have no symptoms and you get: ? Belly pain. ? Discomfort, burning, or fullness in the upper part of your belly (indigestion). Get help right away if:  You have sudden pain in the upper right side of your belly, and it  lasts for more than 2 hours.  You have belly pain that lasts for more than 5 hours.  You have a fever or chills.  You keep feeling sick to your stomach or you keep throwing up.  Your skin or the whites of your eyes turn yellow (jaundice).  You have dark-colored pee (urine).  You have light-colored poop (stool). Summary  Cholelithiasis is also called "gallstones."  The gallbladder is an organ that stores a liquid (bile) that helps you digest fat.  Silent gallstones are gallstones that do not cause symptoms.  A gallbladder attack may cause sudden pain in the upper right side of your belly. Pain might spread to your right shoulder or your chest. If this happens, contact your doctor.  If you have sudden pain in the upper right side of your belly that lasts for more than 2 hours, get help right away. This information is not intended to replace advice given to you by your health care provider. Make sure you discuss any questions you have with your health care provider. Document Revised: 12/10/2016 Document Reviewed: 09/14/2015 Elsevier Patient Education  2020 [employer] Inc[employer]   Pancreatitis Eating Plan Pancreatitis is when your pancreas becomes irritated and swollen (inflamed). The pancreas is a small organ located behind your stomach. It helps your body digest food and regulate your blood sugar. Pancreatitis can affect how your body digests food, especially foods with fat. You may also have other symptoms such as abdominal pain or nausea. When you have pancreatitis, following a low-fat eating plan may help you manage symptoms and recover more quickly. Work with your health care provider or a diet and nutrition specialist (dietitian) to create an eating plan that is right for you. What are tips for following this plan? Reading food labels Use the information on food labels to help keep track of how much fat you eat:  Check the serving size.  Look for the amount of total fat in grams (g)  in one serving. ? Low-fat foods have 3 g of fat or less per serving. ? Fat-free foods have 0.5 g of fat or less per serving.  Keep track of how much fat you eat based on how many servings you eat. ? For example, if you eat two servings, the amount of fat you eat will be two times what is listed on the label. Shopping   Buy low-fat or nonfat foods, such as: ? Fresh, frozen, or canned fruits and vegetables. ? Grains, including pasta, bread, and rice. ? Lean meat, poultry, fish, and other protein foods. ? Low-fat or nonfat dairy.  Avoid buying bakery products and other sweets made with whole milk, butter, and eggs.  Avoid buying snack foods with added fat, such as anything with butter or cheese flavoring. Cooking  Remove skin from poultry, and remove extra fat from meat.  Limit the amount of fat and oil  you use to 6 teaspoons or less per day.  Cook using low-fat methods, such as boiling, broiling, grilling, steaming, or baking.  Use spray oil to cook. Add fat-free chicken broth to add flavor and moisture.  Avoid adding cream to thicken soups or sauces. Use other thickeners such as corn starch or tomato paste. Meal planning   Eat a low-fat diet as told by your dietitian. For most people, this means having no more than 55-65 grams of fat each day.  Eat small, frequent meals throughout the day. For example, you may have 5-6 small meals instead of 3 large meals.  Drink enough fluid to keep your urine pale yellow.  Do not drink alcohol. Talk to your health care provider if you need help stopping.  Limit how much caffeine you have, including black coffee, black and green tea, caffeinated soft drinks, and energy drinks. General information  Let your health care provider or dietitian know if you have unplanned weight loss on this eating plan.  You may be instructed to follow a clear liquid diet during a flare of symptoms. Talk with your health care provider about how to manage your  diet during symptoms of a flare.  Take any vitamins or supplements as told by your health care provider.  Work with a Microbiologist, especially if you have other conditions such as obesity or diabetes mellitus. What foods should I avoid? Fruits Fried fruits. Fruits served with butter or cream. Vegetables Fried vegetables. Vegetables cooked with butter, cheese, or cream. Grains Biscuits, waffles, donuts, pastries, and croissants. Pies and cookies. Butter-flavored popcorn. Regular crackers. Meats and other protein foods Fatty cuts of meat. Poultry with skin. Organ meats. Bacon, sausage, and cold cuts. Whole eggs. Nuts and nut butters. Dairy Whole and 2% milk. Whole milk yogurt. Whole milk ice cream. Cream and half-and-half. Cream cheese. Sour cream. Cheese. Beverages Wine, beer, and liquor. The items listed above may not be a complete list of foods and beverages to avoid. Contact a dietitian for more information. Summary  Pancreatitis can affect how your body digests food, especially foods with fat.  When you have pancreatitis, it is recommended that you follow a low-fat eating plan to help you recover more quickly and manage symptoms. For most people, this means limiting fat to no more than 55-65 grams per day.  Do not drink alcohol. Limit the amount of caffeine you have, and drink enough fluid to keep your urine pale yellow. This information is not intended to replace advice given to you by your health care provider. Make sure you discuss any questions you have with your health care provider. Document Revised: 04/20/2018 Document Reviewed: 04/05/2017 Elsevier Patient Education  [employer].

## 2021-11-15 IMAGING — CT CT ABD-PELV W/ CM
2 of 4 series · 15 of 46 positions shown, 17 images · IV contrast (omnipaque)
Comparison: Abdominal ultrasound yesterday.

CLINICAL DATA: Pancreatitis.  Epigastric pain.

EXAM:
CT ABDOMEN AND PELVIS WITH CONTRAST
TECHNIQUE: Multidetector CT imaging of the abdomen and pelvis was performed
using the standard protocol following bolus administration of
intravenous contrast.
CONTRAST:  100mL OMNIPAQUE IOHEXOL 300 MG/ML  SOLN

[Series 2: axial st · axial · 0.74mm/px · z∈[-459,-54]mm · 12 of 93 slices shown, 14 images]
[im 6/93  soft-tissue]
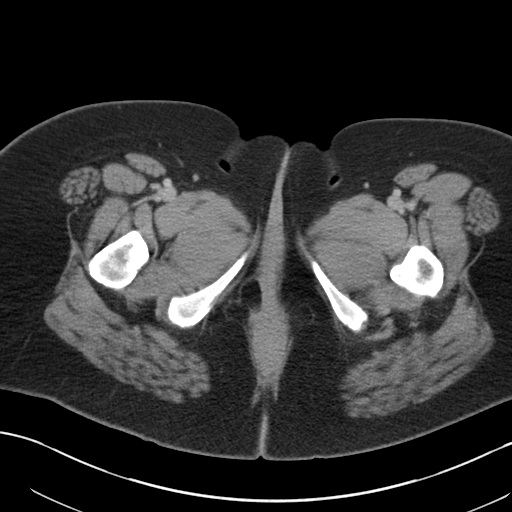
[im 6/93  bone]
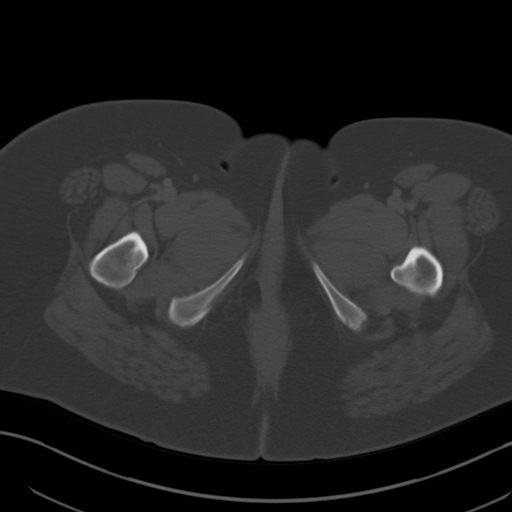
[im 16/93  soft-tissue]
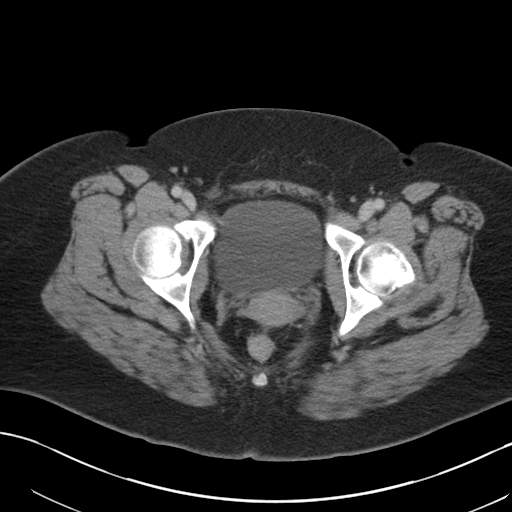
[im 21/93  soft-tissue]
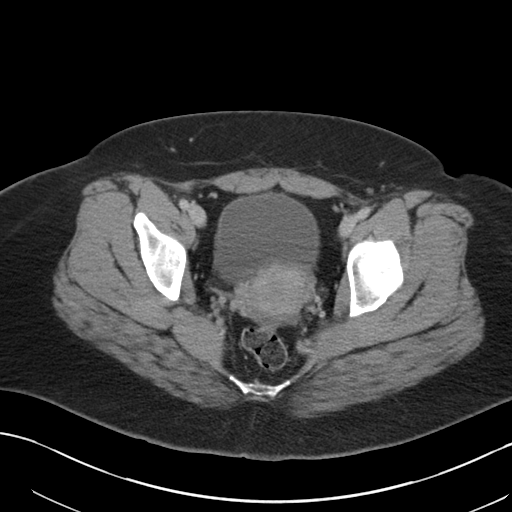
[im 26/93  soft-tissue]
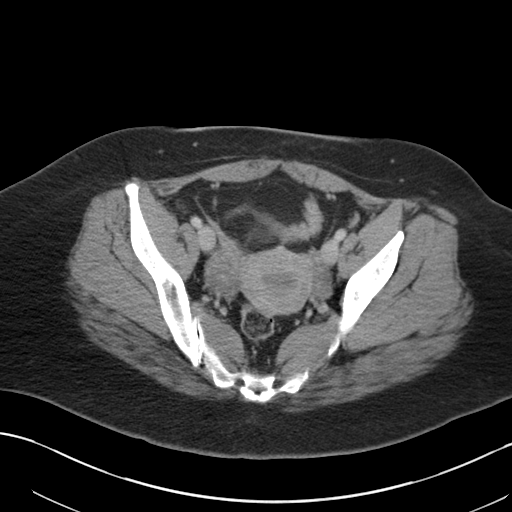
[im 36/93  soft-tissue]
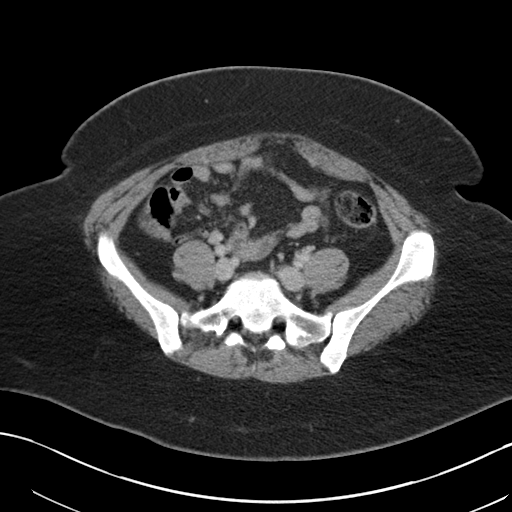
[im 41/93  soft-tissue]
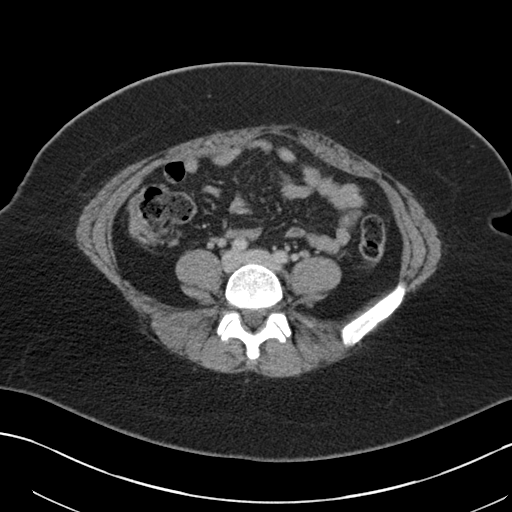
[im 52/93  soft-tissue]
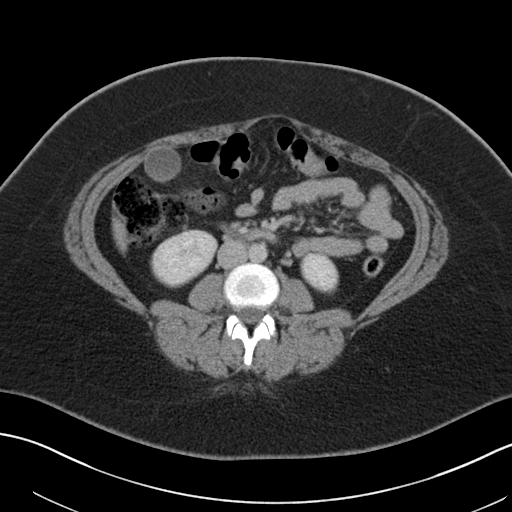
[im 57/93  soft-tissue]
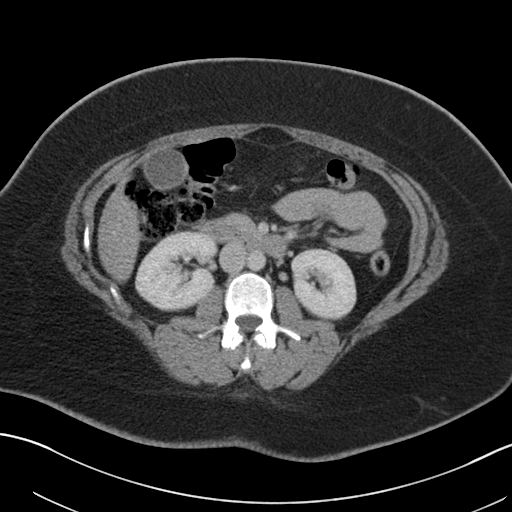
[im 67/93  soft-tissue]
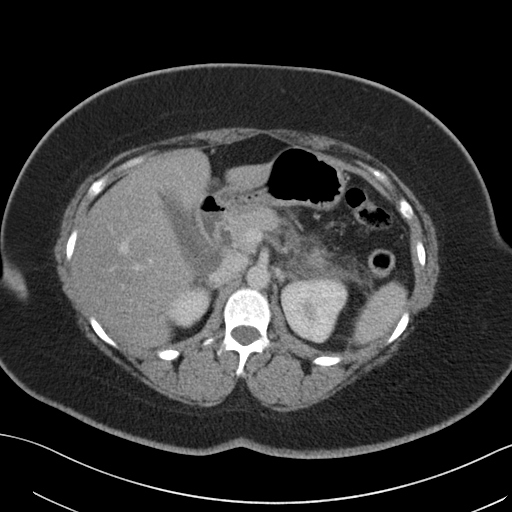
[im 67/93  bone]
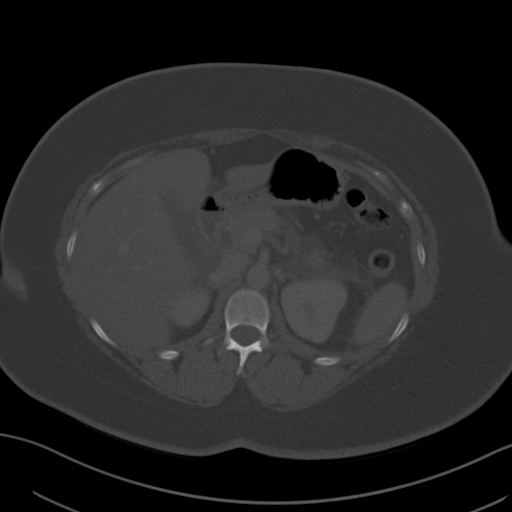
[im 72/93  soft-tissue]
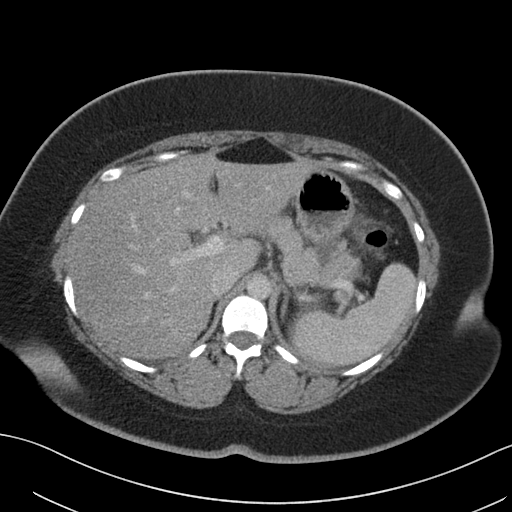
[im 77/93  soft-tissue]
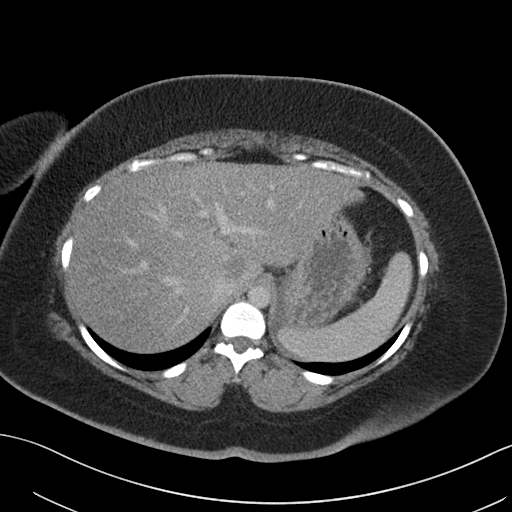
[im 87/93  soft-tissue]
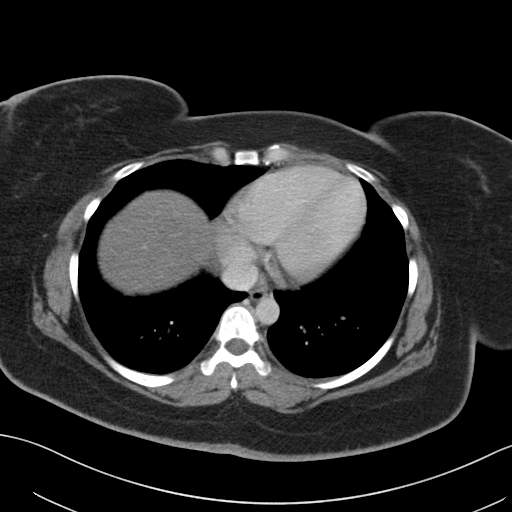

[Series 5: coronal st · coronal · 0.73mm/px · 3 of 142 slices shown]
[im 48/142  soft-tissue]
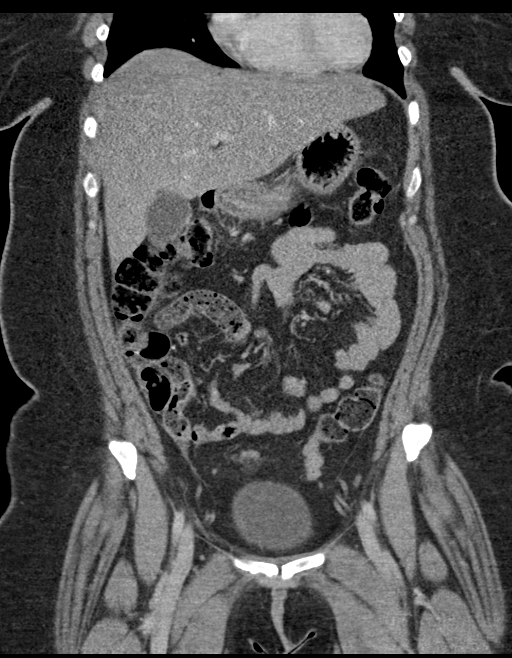
[im 63/142  soft-tissue]
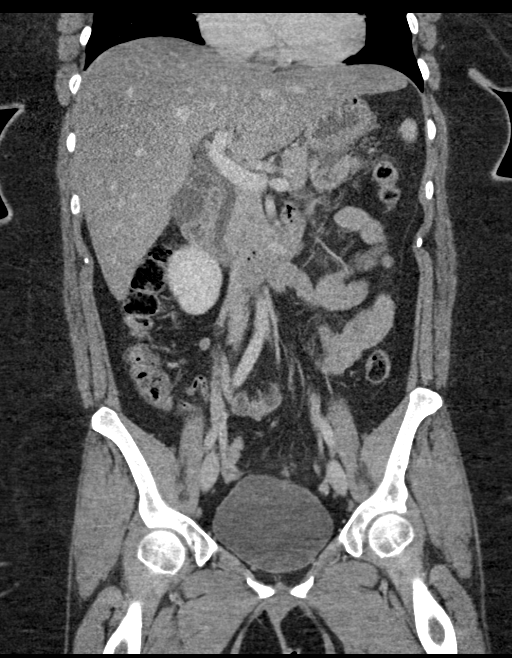
[im 79/142  soft-tissue]
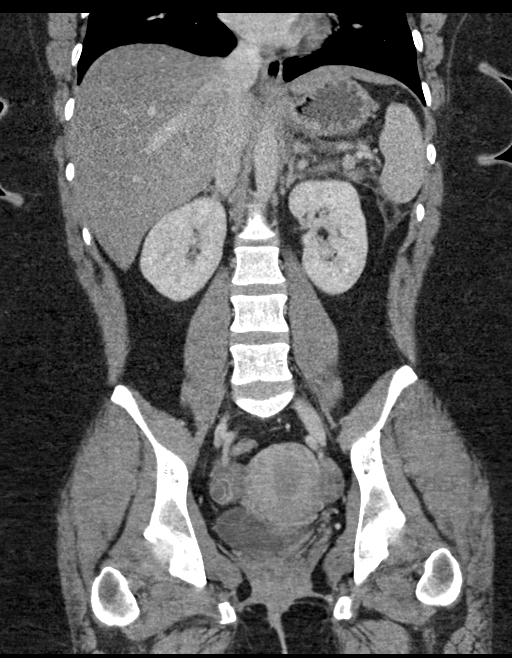

[15 of 46 positions shown; findings below may reference images not displayed]

FINDINGS: Lower chest: Lung bases are clear.  No pleural fluid.

Hepatobiliary: Diffusely decreased hepatic density consistent with
steatosis. Ill-defined central hypodense area measuring 2 cm common
series 2, image [DATE] represent more focal fatty infiltration
versus underlying lesion. Gallstones on ultrasound are not
well-defined by CT. Common bile duct appears mildly dilated
measuring 8 mm. No intrahepatic biliary ductal dilatation.

Pancreas: Moderate peripancreatic fat stranding about the distal
body and tail. No ductal dilatation. Homogeneous enhancement without
necrosis. No acute peripancreatic collection. No evidence of
underlying pancreatic mass.

Spleen: Normal in size without focal abnormality. Splenule at the
hilum.

Adrenals/Urinary Tract: Normal adrenal glands. No hydronephrosis or
perinephric edema. Homogeneous renal enhancement. Urinary bladder is
physiologically distended without wall thickening.

Stomach/Bowel: Stomach and duodenum are unremarkable. No small bowel
obstruction or dilatation. There is slight fecalization of distal
small bowel contents. Normal appendix. Small to moderate volume of
stool throughout the colon. No colonic wall thickening.

Vascular/Lymphatic: Patent portal and splenic veins. Patent
mesenteric veins. Normal caliber abdominal aorta. Few prominent
central mesenteric nodes are likely reactive.

Reproductive: Physiologic corpus luteal cyst on the right. No
adnexal mass. Uterus is unremarkable.

Other: Peripancreatic fat stranding with minimal adjacent fluid. No
organized fluid collection. No ascites elsewhere. No free air. Small
fat containing umbilical hernia.

Musculoskeletal: There are no acute or suspicious osseous
abnormalities. Degenerative change of the pubic symphysis.
IMPRESSION: 1. Acute edematous pancreatitis. No acute peripancreatic collection
or pancreatic necrosis.
2. Hepatic steatosis. Ill-defined central hypodense area measuring 2
cm may represent more focal fatty infiltration versus underlying
lesion.
3. Mild common bile duct dilatation measuring 8 mm. No intrahepatic
biliary ductal dilatation. Gallstones on ultrasound are not
well-defined by CT.
4. Small fat containing umbilical hernia.

## 2021-11-17 IMAGING — RF DG CHOLANGIOGRAM OPERATIVE
1 series · 8 of 8 positions shown · non-contrast
Comparison: 06/21/2019, 06/22/2019

CLINICAL DATA: Cholelithiasis, intraoperative cholangiogram

EXAM:
INTRAOPERATIVE CHOLANGIOGRAM
TECHNIQUE: Cholangiographic images from the C-arm fluoroscopic device were
submitted for interpretation post-operatively. Please see the
procedural report for the amount of contrast and the fluoroscopy
time utilized.

[Series 1: run · 2 acquisitions, 8 frames shown]
[im 1/2]
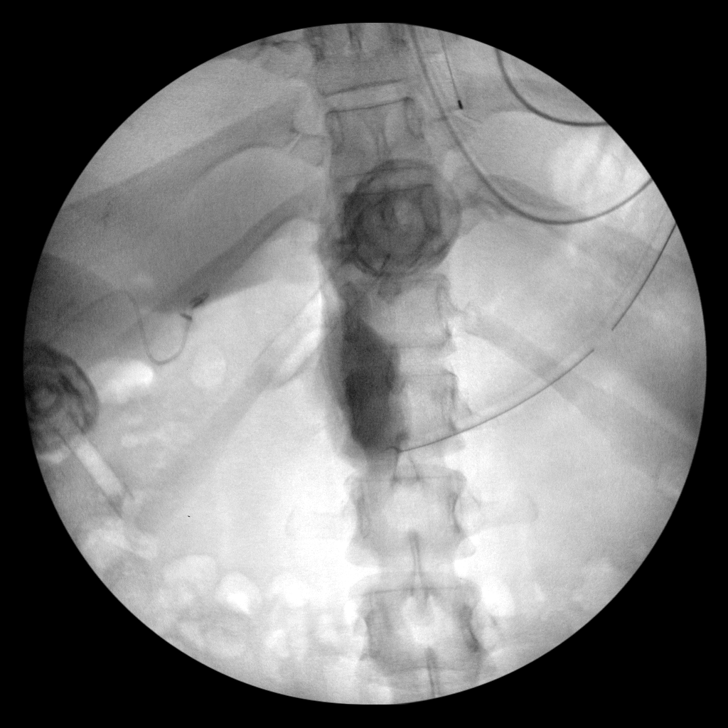
[im 1/2]
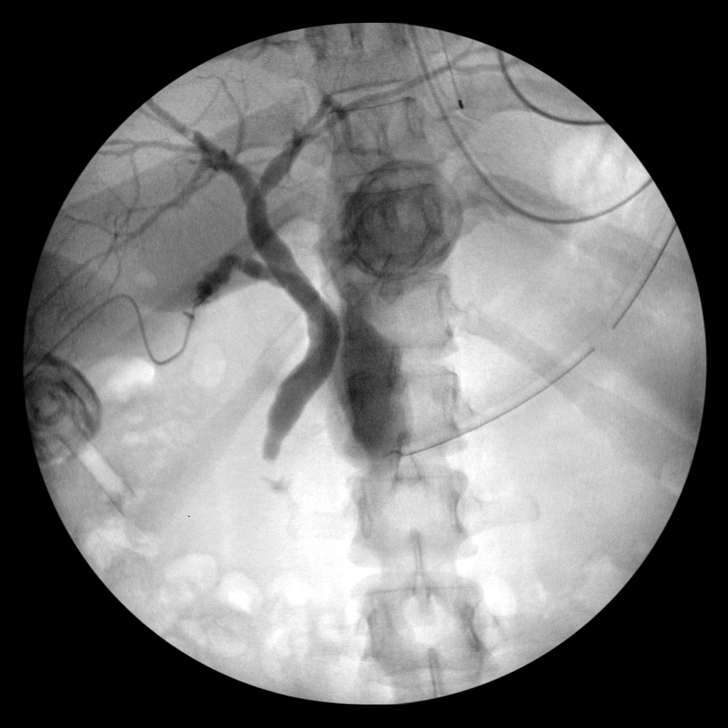
[im 1/2]
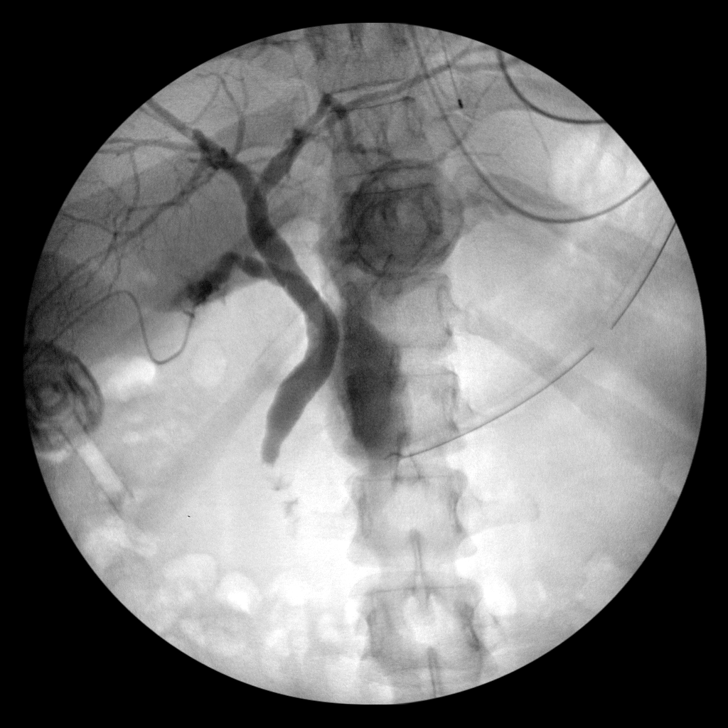
[im 1/2]
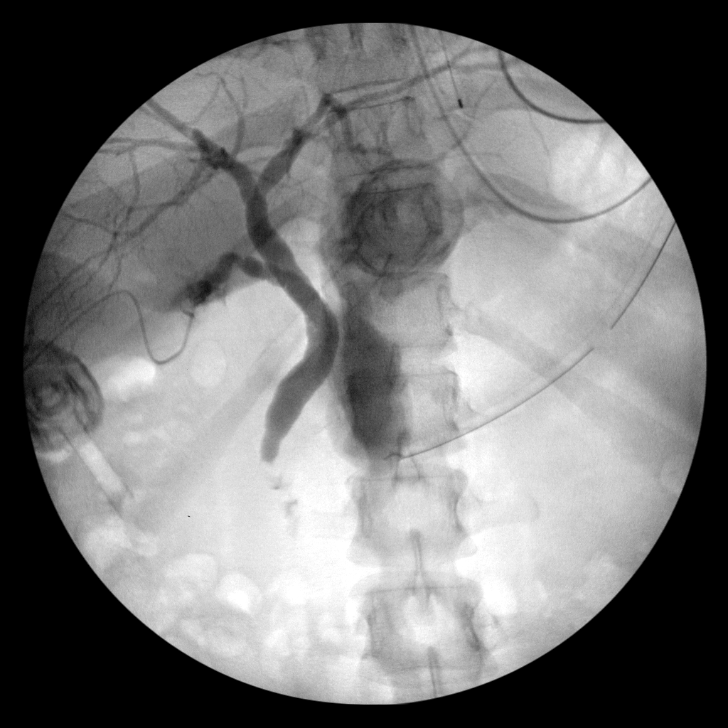
[im 2/2]
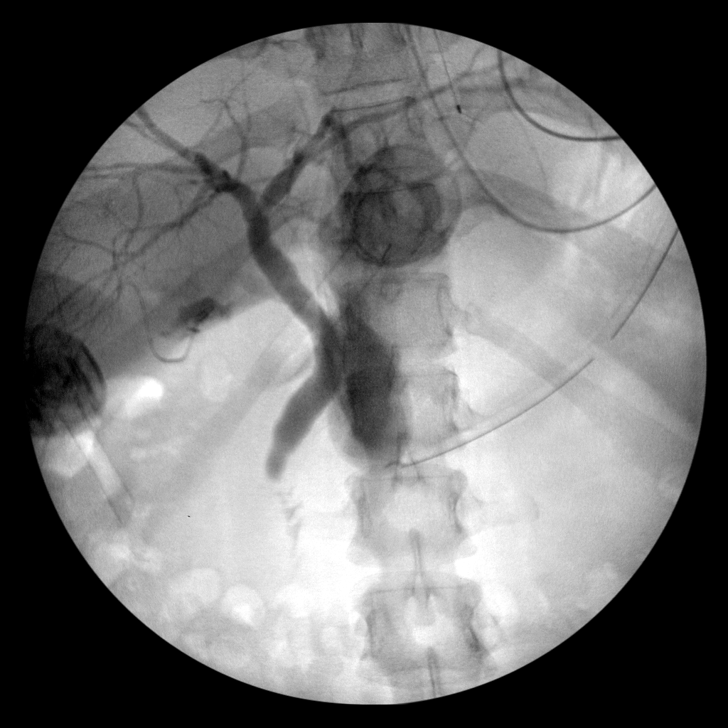
[im 2/2]
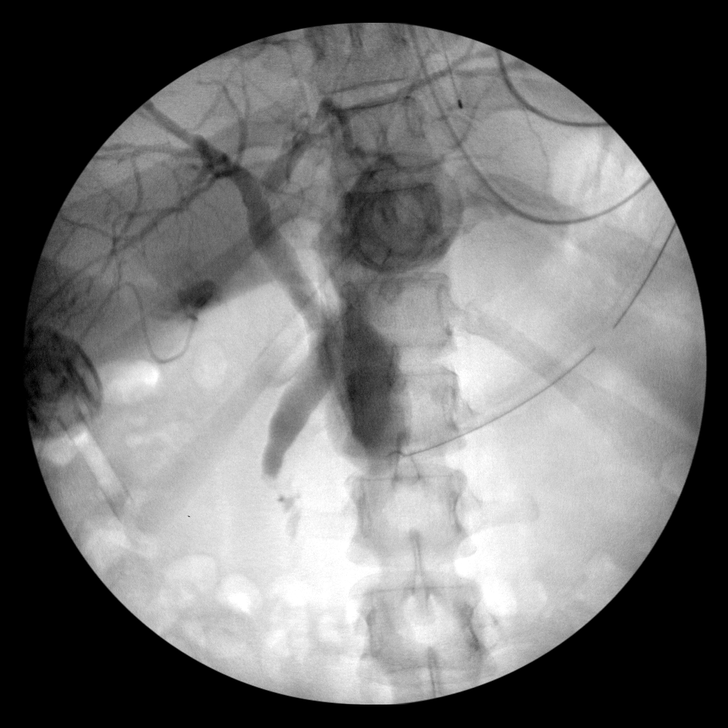
[im 2/2]
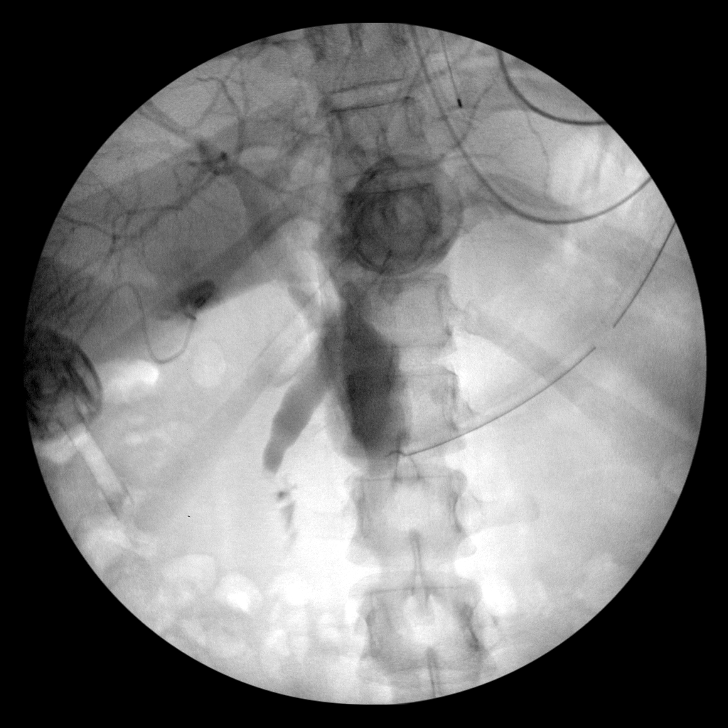
[im 2/2]
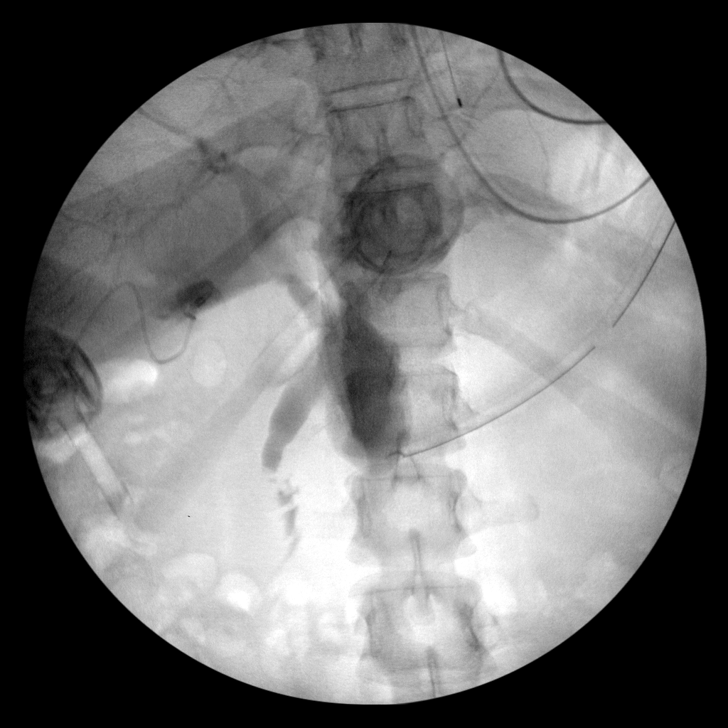

[8 of 8 positions shown; findings below may reference images not displayed]

FINDINGS: Intraoperative cholangiogram performed during the laparoscopic
procedure. The residual cystic duct, biliary confluence, common
hepatic duct, and common bile duct are patent. However, distal CBD
small round filling defect persist on several images. Only a small
amount of contrast passes through the distal CBD into the duodenum.
Difficult to exclude distal choledocholithiasis.
IMPRESSION: Possible distal CBD choledocholithiasis.

## 2021-11-18 IMAGING — RF DG ERCP WO/W SPHINCTEROTOMY
1 series · 13 of 13 positions shown · non-contrast
Comparison: CT abdomen and pelvis-06/22/2019;

CLINICAL DATA: Acute gallstone pancreatitis.

EXAM:
ERCP
TECHNIQUE: Multiple spot images obtained with the fluoroscopic device and
submitted for interpretation post-procedure.

[Series 1: run · 13 of 13 slices shown]
[im 1/13]
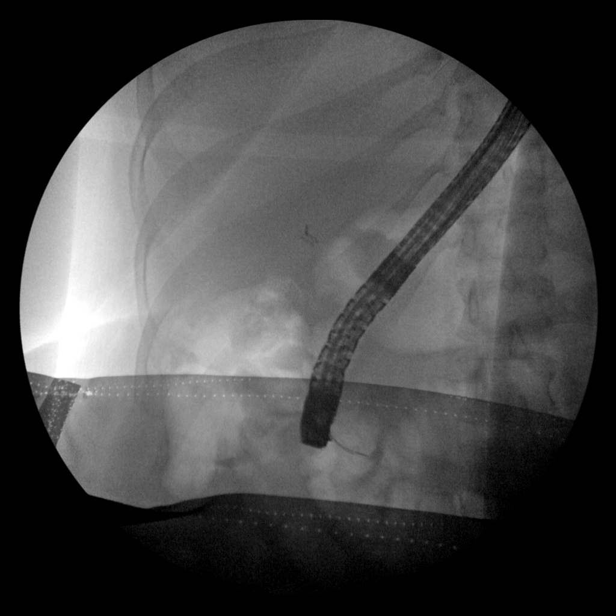
[im 2/13]
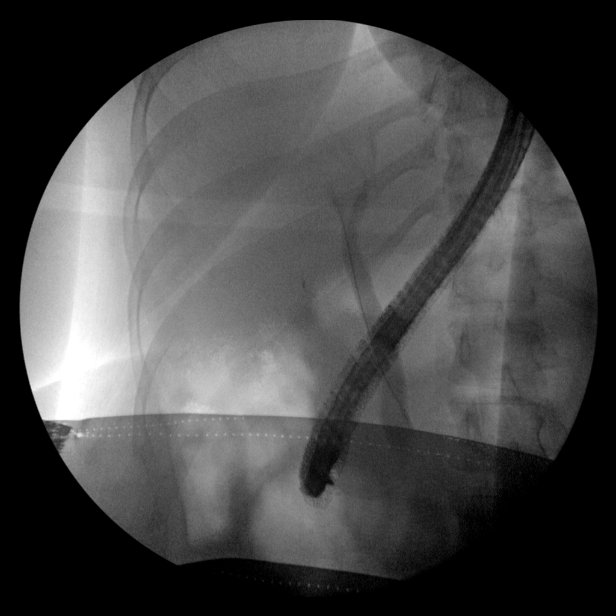
[im 3/13]
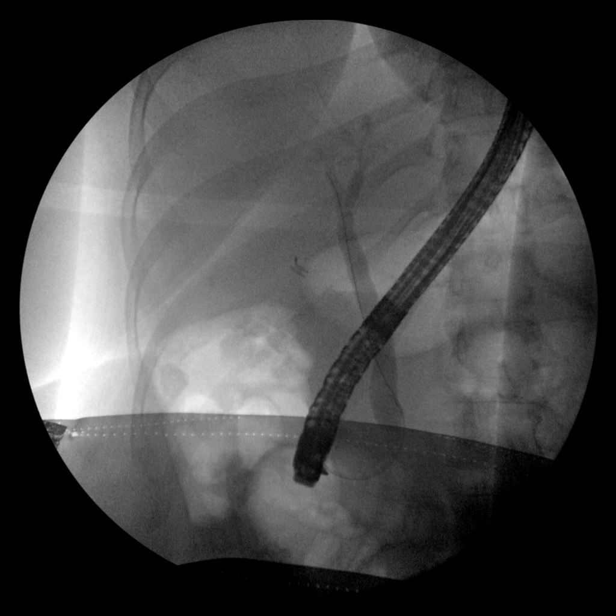
[im 4/13]
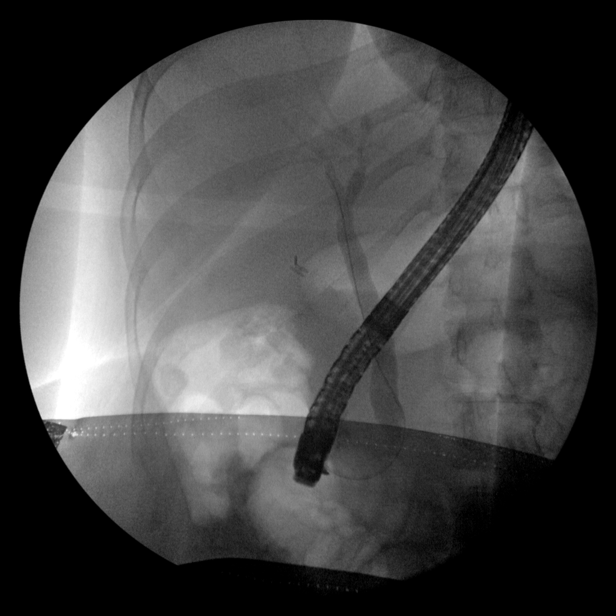
[im 5/13]
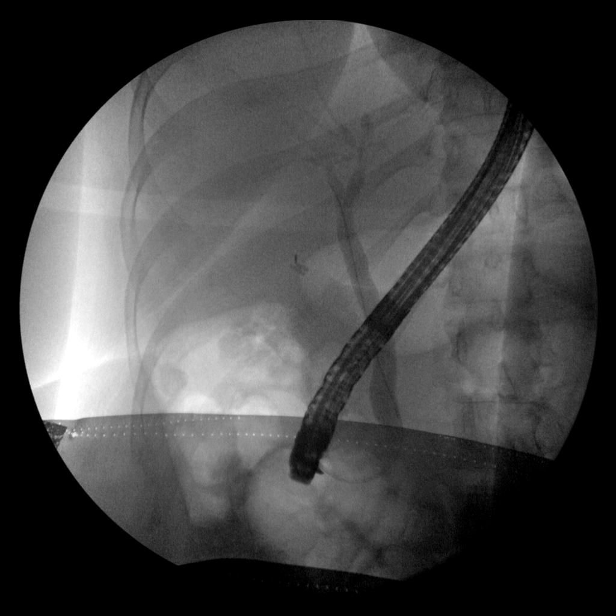
[im 6/13]
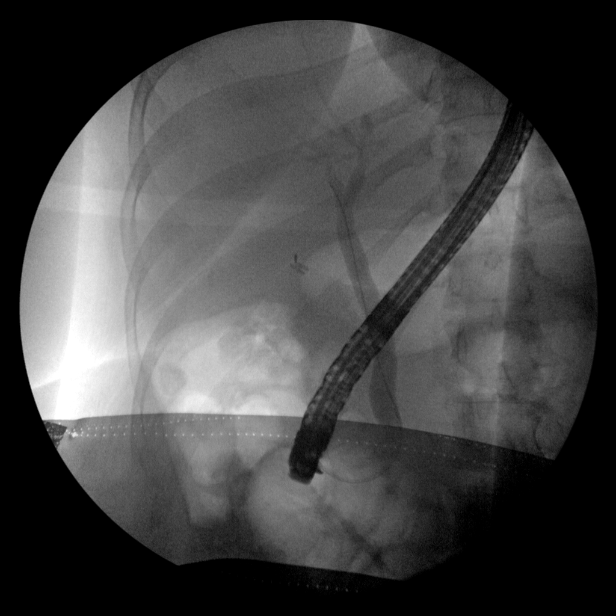
[im 7/13]
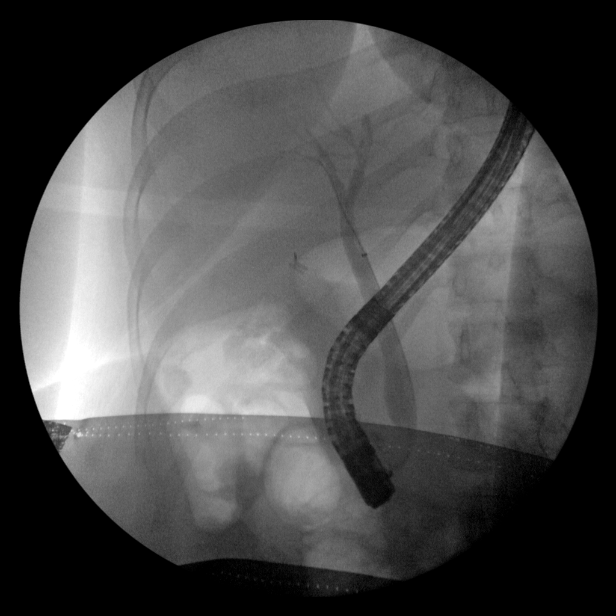
[im 8/13]
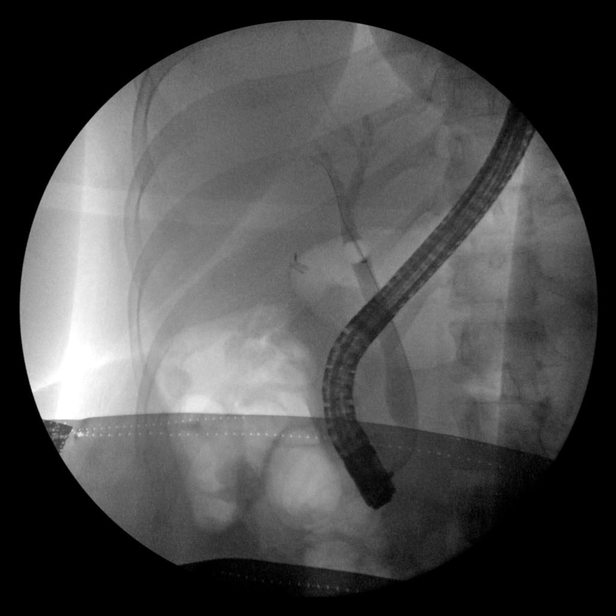
[im 9/13]
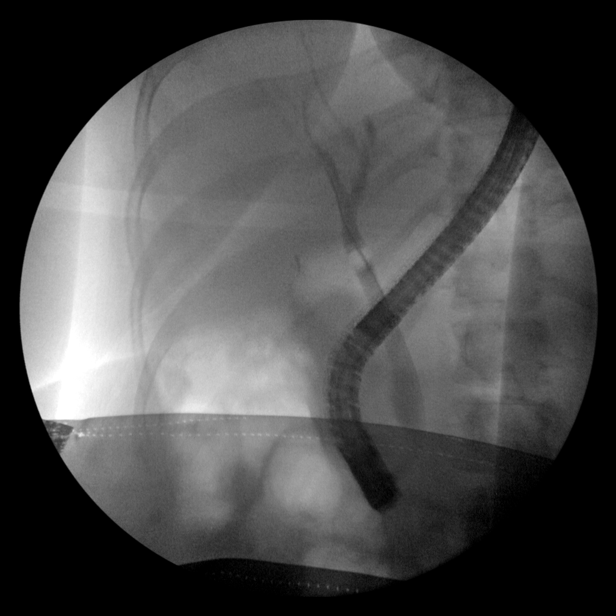
[im 10/13]
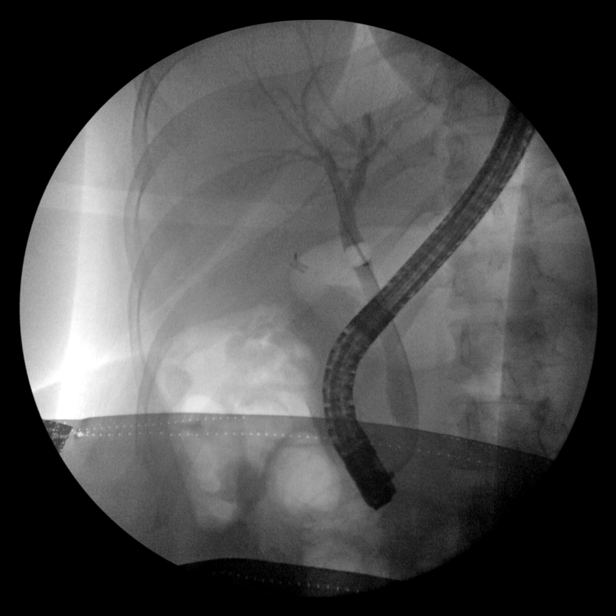
[im 11/13]
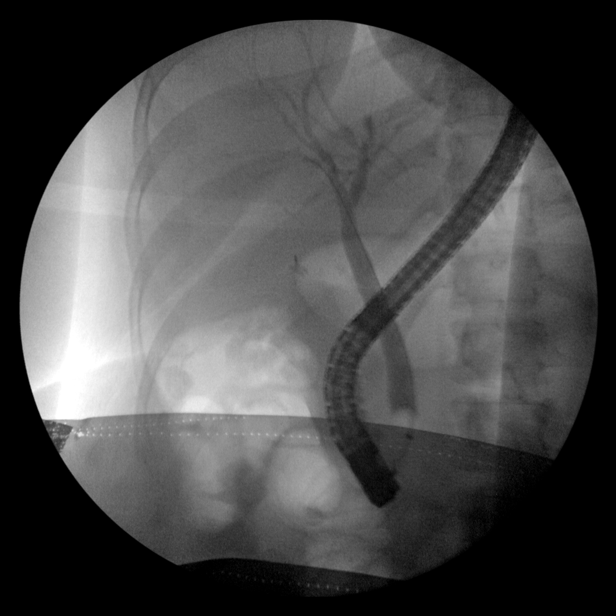
[im 12/13]
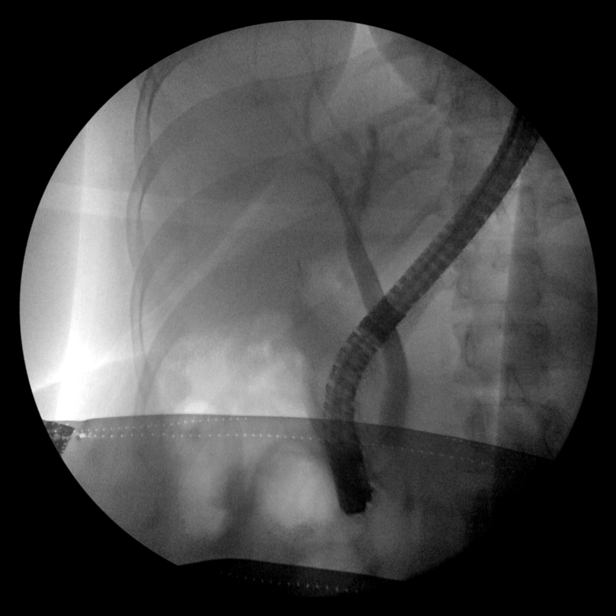
[im 13/13]
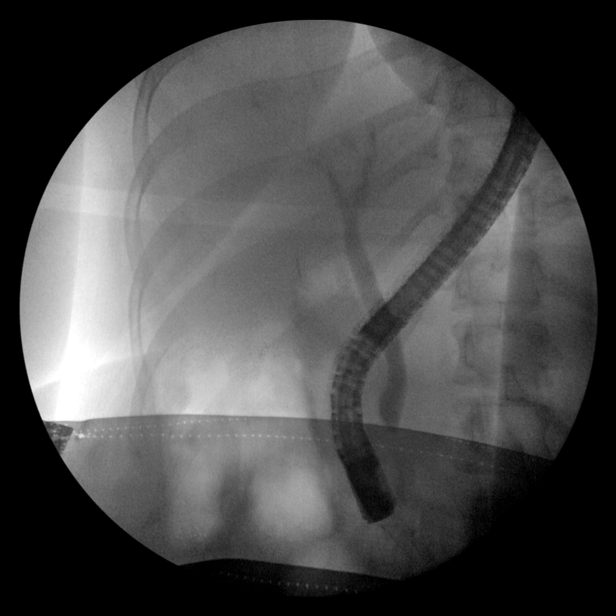

[13 of 13 positions shown; findings below may reference images not displayed]

cholangio graphic
images during intraoperative cholangiogram-06/24/2019

FLUOROSCOPY TIME:  1 minutes, 42 seconds
FINDINGS: 13 spot intraoperative fluoroscopic images of the right upper
abdominal quadrant during ERCP are provided for review

Initial image demonstrates an ERCP probe overlying the right upper
abdominal quadrant.

Cholecystectomy clips overlies expected location of the gallbladder
fossa

Subsequent images demonstrate selective cannulation and
opacification of the common bile duct which appears moderately
dilated.

There are no definitive persistent filling defect in the pacified
portion of the biliary tree, though note, evaluation the distal most
aspect of the CBD is degraded secondary to overlying radiopaque
surgical support material.

Subsequent images demonstrate insufflation of a balloon within the
central aspect of the biliary tree with subsequent biliary sweeping
and presumed sphincterotomy.
IMPRESSION: ERCP with biliary sweeping and presumed sphincterotomy as above.

These images were submitted for radiologic interpretation only.
Please see the procedural report for the amount of contrast and the
fluoroscopy time utilized.
